# Patient Record
Sex: Female | Born: 1965 | Race: White | Hispanic: No | Smoking: Current every day smoker
Health system: Southern US, Community
[De-identification: ages and names within clinical notes are randomized; demographics above are authoritative.]

## PROBLEM LIST (undated history)

## (undated) DIAGNOSIS — T7840XA Allergy, unspecified, initial encounter: Secondary | ICD-10-CM

## (undated) DIAGNOSIS — F1020 Alcohol dependence, uncomplicated: Secondary | ICD-10-CM

## (undated) DIAGNOSIS — IMO0002 Reserved for concepts with insufficient information to code with codable children: Secondary | ICD-10-CM

## (undated) DIAGNOSIS — F419 Anxiety disorder, unspecified: Secondary | ICD-10-CM

## (undated) DIAGNOSIS — K219 Gastro-esophageal reflux disease without esophagitis: Secondary | ICD-10-CM

## (undated) HISTORY — DX: Reserved for concepts with insufficient information to code with codable children: IMO0002

## (undated) HISTORY — PX: TUBAL LIGATION: SHX77

## (undated) HISTORY — DX: Anxiety disorder, unspecified: F41.9

## (undated) HISTORY — PX: ABDOMINAL HYSTERECTOMY: SHX81

## (undated) HISTORY — DX: Gastro-esophageal reflux disease without esophagitis: K21.9

## (undated) HISTORY — DX: Allergy, unspecified, initial encounter: T78.40XA

---

## 1998-02-12 ENCOUNTER — Encounter: Admission: RE | Admit: 1998-02-12 | Discharge: 1998-02-12 | Payer: Self-pay | Admitting: Internal Medicine

## 1998-07-19 ENCOUNTER — Emergency Department (HOSPITAL_COMMUNITY): Admission: EM | Admit: 1998-07-19 | Discharge: 1998-07-19 | Payer: Self-pay | Admitting: Emergency Medicine

## 1998-07-21 ENCOUNTER — Emergency Department (HOSPITAL_COMMUNITY): Admission: EM | Admit: 1998-07-21 | Discharge: 1998-07-21 | Payer: Self-pay | Admitting: Internal Medicine

## 1998-08-01 ENCOUNTER — Emergency Department (HOSPITAL_COMMUNITY): Admission: EM | Admit: 1998-08-01 | Discharge: 1998-08-01 | Payer: Self-pay | Admitting: Emergency Medicine

## 1998-11-03 ENCOUNTER — Emergency Department (HOSPITAL_COMMUNITY): Admission: EM | Admit: 1998-11-03 | Discharge: 1998-11-03 | Payer: Self-pay | Admitting: Emergency Medicine

## 1998-11-26 ENCOUNTER — Encounter: Admission: RE | Admit: 1998-11-26 | Discharge: 1998-11-26 | Payer: Self-pay | Admitting: Hematology and Oncology

## 2002-01-12 ENCOUNTER — Emergency Department (HOSPITAL_COMMUNITY): Admission: EM | Admit: 2002-01-12 | Discharge: 2002-01-12 | Payer: Self-pay | Admitting: Emergency Medicine

## 2015-09-22 ENCOUNTER — Emergency Department (HOSPITAL_COMMUNITY)
Admission: EM | Admit: 2015-09-22 | Discharge: 2015-09-22 | Disposition: A | Discharge status: Home / Self Care | Payer: Self-pay | Attending: Emergency Medicine | Admitting: Emergency Medicine

## 2015-09-22 ENCOUNTER — Encounter (HOSPITAL_COMMUNITY): Payer: Self-pay | Admitting: Family Medicine

## 2015-09-22 DIAGNOSIS — K029 Dental caries, unspecified: Secondary | ICD-10-CM | POA: Insufficient documentation

## 2015-09-22 DIAGNOSIS — F17210 Nicotine dependence, cigarettes, uncomplicated: Secondary | ICD-10-CM | POA: Insufficient documentation

## 2015-09-22 DIAGNOSIS — K002 Abnormalities of size and form of teeth: Secondary | ICD-10-CM | POA: Insufficient documentation

## 2015-09-22 DIAGNOSIS — Z880 Allergy status to penicillin: Secondary | ICD-10-CM | POA: Insufficient documentation

## 2015-09-22 DIAGNOSIS — K0381 Cracked tooth: Secondary | ICD-10-CM | POA: Insufficient documentation

## 2015-09-22 DIAGNOSIS — K0889 Other specified disorders of teeth and supporting structures: Secondary | ICD-10-CM | POA: Insufficient documentation

## 2015-09-22 DIAGNOSIS — Z8719 Personal history of other diseases of the digestive system: Secondary | ICD-10-CM | POA: Insufficient documentation

## 2015-09-22 MED ORDER — CLINDAMYCIN HCL 150 MG PO CAPS: 300.0000 mg | ORAL_CAPSULE | Freq: Three times a day (TID) | ORAL | Status: DC

## 2015-09-22 MED ORDER — HYDROCODONE-ACETAMINOPHEN 5-325 MG PO TABS
1.0000 | ORAL_TABLET | Freq: Once | ORAL | Status: AC
  Administered 2015-09-22: 1 via ORAL
  Filled 2015-09-22: qty 1

## 2015-09-22 NOTE — Discharge Instructions (Signed)
Take your medications as prescribed. Continue taking Tylenol as prescribed over-the-counter for pain relief. You may also apply ice for 10-15 minutes 3-4 times daily for pain relief. Follow-up with your dentist at your scheduled appointment this week. Return to the emergency department if symptoms worsen or new onset of fever, difficulty breathing, difficulty swallowing, drooling, facial or neck swelling.

## 2015-09-22 NOTE — ED Provider Notes (Signed)
CSN: [id number]     Arrival date & time 09/22/15  1144 History  By signing my name below, I, Marica Otter, attest that this documentation has been prepared under the direction and in the presence of Melburn Hake, [place]. Electronically Signed: Marica Otter, ED Scribe. 09/22/2015. 1:15 PM.  Chief Complaint  Patient presents with  . Dental Pain   The history is provided by the patient. No language interpreter was used.   PCP: No primary care provider on file. HPI Comments: Deborah Peterson is a 49 y.o. female, with PMHx noted below, who presents to the Emergency Department complaining of 5/10 dental pain resulting from a broken tooth onset 4 days ago. Pt endorses associated drainage from affected site. Pt reports taking tylenol at home with minimal relief. Pt reports she has a dentist appointment on Friday. Pt reports an allergy to penicillin and states she is unable to take ibuprofen due to stomach ulcers.  Pt denies neck or facial swelling, sore throat, fever, difficulty swallowing, drooling.    History reviewed. No pertinent past medical history. Past Surgical History  Procedure Laterality Date  . Cesarean section    . Abdominal hysterectomy     History reviewed. No pertinent family history. Social History  Substance Use Topics  . Smoking status: Current Every Day Smoker  . Smokeless tobacco: None  . Alcohol Use: No   OB History    No data available     Review of Systems  Constitutional: Negative for fever and chills.  HENT: Positive for dental problem. Negative for drooling, facial swelling, sore throat and trouble swallowing.    Allergies  Motrin; Penicillins; and Tramadol  Home Medications   Prior to Admission medications   Medication Sig Start Date End Date Taking? Authorizing Provider  clindamycin (CLEOCIN) 150 MG capsule Take 2 capsules (300 mg total) by mouth 3 (three) times daily. May dispense as  capsules 09/22/15   Barrett Henle, PA-C   Triage  Vitals: BP 137/82 mmHg  Pulse 110  Temp(Src) 98.5 F (36.9 C)  Resp 18  Ht  (1.549 m)  Wt 115 lb (52.164 kg)  BMI 21.74 kg/m2  SpO2 100% Physical Exam  Constitutional: She is oriented to person, place, and time. She appears well-developed and well-nourished.  HENT:  Head: Normocephalic.  Mouth/Throat: Uvula is midline, oropharynx is clear and moist and mucous membranes are normal. No oral lesions. No trismus in the jaw. Abnormal dentition. Dental caries present. No dental abscesses or uvula swelling.    Eyes: EOM are normal.  Neck: Normal range of motion. Neck supple.  Cardiovascular: Normal rate.   Pulmonary/Chest: Effort normal. No stridor. No respiratory distress.  Abdominal: She exhibits no distension.  Musculoskeletal: Normal range of motion.  Lymphadenopathy:    She has no cervical adenopathy.  Neurological: She is alert and oriented to person, place, and time.  Psychiatric: She has a normal mood and affect.  Nursing note and vitals reviewed.  ED Course  Procedures (including critical care time)  DIAGNOSTIC STUDIES: Oxygen Saturation is 100% on ra, nl by my interpretation.    COORDINATION OF CARE: 12:57 PM: Discussed treatment plan which includes antibiotics and pain meds with pt at bedside; patient verbalizes understanding and agrees with treatment plan.   MDM   Final diagnoses:  Pain, dental    Pt presents with left upper molar pain. Endorses drainage. Denies fever. She has a dentist appointment on Friday. VSS. Exam revealed decaying left upper molar with tooth receding to  gumline, mild surrounding erythema and swelling, no evidence of dental abscess. No trismus, drooling, neck swelling or stridor on exam. Pt given pain meds. Plan to d/c pt home. Pt given clindamycin (due to penicillin allergy) and advised to continue taking tylenol for pain and follow up with dentist at her scheudled appointment.   Evaluation does not show pathology requring ongoing emergent  intervention or admission. Pt is hemodynamically stable and mentating appropriately. Discussed findings/results and plan with patient/guardian, who agrees with plan. All questions answered. Return precautions discussed and outpatient follow up given.    I personally performed the services described in this documentation, which was scribed in my presence. The recorded information has been reviewed and is accurate.    Satira Sarkicole Elizabeth PrescottNadeau, [place]-C 09/22/15 1953  Geoffery Lyonsouglas Delo, MD 09/23/15 [phone]

## 2015-09-22 NOTE — ED Notes (Signed)
Pt here for broken tooth. Sts she has an appointment this week to get it out.

## 2015-09-22 NOTE — ED Notes (Signed)
Patient has broken tooth, Patient states has appointment with dentisit on Friday but needs medication for pain,

## 2016-11-04 ENCOUNTER — Other Ambulatory Visit: Payer: Self-pay | Admitting: Gastroenterology

## 2016-11-04 DIAGNOSIS — R1310 Dysphagia, unspecified: Secondary | ICD-10-CM

## 2016-11-25 ENCOUNTER — Other Ambulatory Visit: Payer: Self-pay

## 2016-12-16 ENCOUNTER — Inpatient Hospital Stay: Admission: RE | Admit: 2016-12-16 | Payer: Self-pay | Source: Ambulatory Visit

## 2020-08-12 ENCOUNTER — Other Ambulatory Visit: Payer: Self-pay

## 2020-08-12 ENCOUNTER — Encounter (HOSPITAL_COMMUNITY): Payer: Self-pay

## 2020-08-12 ENCOUNTER — Emergency Department (HOSPITAL_COMMUNITY): Payer: 59

## 2020-08-12 ENCOUNTER — Emergency Department (HOSPITAL_COMMUNITY)
Admission: EM | Admit: 2020-08-12 | Discharge: 2020-08-12 | Disposition: A | Discharge status: Home / Self Care | Payer: 59 | Attending: Emergency Medicine | Admitting: Emergency Medicine

## 2020-08-12 DIAGNOSIS — R0789 Other chest pain: Secondary | ICD-10-CM

## 2020-08-12 DIAGNOSIS — F17200 Nicotine dependence, unspecified, uncomplicated: Secondary | ICD-10-CM | POA: Insufficient documentation

## 2020-08-12 DIAGNOSIS — R072 Precordial pain: Secondary | ICD-10-CM | POA: Diagnosis present

## 2020-08-12 LAB — CBC
HCT: 37.8 % (ref 36.0–46.0)
Hemoglobin: 13.2 g/dL (ref 12.0–15.0)
MCH: 35.4 pg — ABNORMAL HIGH (ref 26.0–34.0)
MCHC: 34.9 g/dL (ref 30.0–36.0)
MCV: 101.3 fL — ABNORMAL HIGH (ref 80.0–100.0)
Platelets: 264 10*3/uL (ref 150–400)
RBC: 3.73 MIL/uL — ABNORMAL LOW (ref 3.87–5.11)
RDW: 11.6 % (ref 11.5–15.5)
WBC: 8 10*3/uL (ref 4.0–10.5)
nRBC: 0 % (ref 0.0–0.2)

## 2020-08-12 LAB — BASIC METABOLIC PANEL
Anion gap: 11 (ref 5–15)
BUN: 6 mg/dL (ref 6–20)
CO2: 26 mmol/L (ref 22–32)
Calcium: 9.1 mg/dL (ref 8.9–10.3)
Chloride: 100 mmol/L (ref 98–111)
Creatinine, Ser: 0.54 mg/dL (ref 0.44–1.00)
GFR calc non Af Amer: >60 mL/min (ref >60)
Glucose, Bld: 108 mg/dL — ABNORMAL HIGH (ref 70–99)
Potassium: 4.1 mmol/L (ref 3.5–5.1)
Sodium: 137 mmol/L (ref 135–145)

## 2020-08-12 LAB — TROPONIN I (HIGH SENSITIVITY): Troponin I (High Sensitivity): 3 ng/L (ref <18)

## 2020-08-12 LAB — I-STAT BETA HCG BLOOD, ED (MC, WL, AP ONLY): I-stat hCG, quantitative: <5 m[IU]/mL (ref <5)

## 2020-08-12 NOTE — ED Notes (Signed)
Went in pt room to obtain discharge VS and go over discharge instructions with pt, but pt had already left. Pt's IV was found on the bedside table. Pt A&Ox4, VSS, ambulatory w/ steady gait prior to pt leaving.

## 2020-08-12 NOTE — ED Notes (Signed)
Pt refused lab draw for repeat trop

## 2020-08-12 NOTE — Discharge Instructions (Addendum)
Please return for any problem.  Follow-up with your regular care providers as instructed.  Follow-up with GI as instructed.

## 2020-08-12 NOTE — ED Provider Notes (Signed)
[facility] EMERGENCY DEPARTMENT Provider Note   CSN: [id number] Arrival date & time: 08/12/20  1418     History No chief complaint on file.   Deborah Peterson is a 54 y.o. female.  54 year old female with prior medical history detailed below presents for evaluation of reported chest discomfort.  Patient reports onset of substernal and epigastric pressure.  This began yesterday around noon.  She reports that she did have some alcohol earlier in the day.  She reports a distant history of stomach ulcers.  She reports minimal alcohol use.  She denies associated nausea, vomiting, bloody emesis, bloody stools, or current pain.  The history is provided by the patient.  Chest Pain Pain location:  Substernal area and epigastric Pain quality: aching and burning   Pain severity:  Mild Onset quality:  Gradual Duration:  1 day Timing:  Constant Progression:  Partially resolved Chronicity:  New Relieved by:  Nothing Worsened by:  Nothing      History reviewed. No pertinent past medical history.  There are no problems to display for this patient.   Past Surgical History:  Procedure Laterality Date  . ABDOMINAL HYSTERECTOMY    . CESAREAN SECTION       OB History   No obstetric history on file.     No family history on file.  Social History   Tobacco Use  . Smoking status: Current Every Day Smoker  Substance Use Topics  . Alcohol use: No  . Drug use: Not on file    Home Medications Prior to Admission medications   Medication Sig Start Date End Date Taking? Authorizing Provider  clindamycin (CLEOCIN) 150 MG capsule Take 2 capsules (300 mg total) by mouth 3 (three) times daily. May dispense as 150mg  capsules 09/22/15   09/24/15, PA-C    Allergies    Motrin [ibuprofen], Penicillins, and Tramadol  Review of Systems   Review of Systems  Cardiovascular: Positive for chest pain.  All other systems reviewed and are  negative.   Physical Exam Updated Vital Signs BP 120/63 (BP Location: Right Arm)   Pulse 63   Temp 97.9 F (36.6 C) (Oral)   Resp 15   Ht 5\' 1"  (1.549 m)   Wt 47.6 kg   SpO2 100%   BMI 19.84 kg/m   Physical Exam Vitals and nursing note reviewed.  Constitutional:      General: She is not in acute distress.    Appearance: Normal appearance. She is well-developed.  HENT:     Head: Normocephalic and atraumatic.  Eyes:     Conjunctiva/sclera: Conjunctivae normal.     Pupils: Pupils are equal, round, and reactive to light.  Cardiovascular:     Rate and Rhythm: Normal rate and regular rhythm.     Heart sounds: Normal heart sounds.  Pulmonary:     Effort: Pulmonary effort is normal. No respiratory distress.     Breath sounds: Normal breath sounds.  Abdominal:     General: There is no distension.     Palpations: Abdomen is soft.     Tenderness: There is no abdominal tenderness.  Musculoskeletal:        General: No deformity. Normal range of motion.     Cervical back: Normal range of motion and neck supple.  Skin:    General: Skin is warm and dry.  Neurological:     Mental Status: She is alert and oriented to person, place, and time.     ED  Results / Procedures / Treatments   Labs (all labs ordered are listed, but only abnormal results are displayed) Labs Reviewed  BASIC METABOLIC PANEL - Abnormal; Notable for the following components:      Result Value   Glucose, Bld 108 (*)    All other components within normal limits  CBC - Abnormal; Notable for the following components:   RBC 3.73 (*)    MCV 101.3 (*)    MCH 35.4 (*)    All other components within normal limits  LIPASE, BLOOD  I-STAT BETA HCG BLOOD, ED ([facility], [facility], [facility] ONLY)  TROPONIN I (HIGH SENSITIVITY)  TROPONIN I (HIGH SENSITIVITY)    EKG EKG Interpretation  Date/Time:  Tuesday August 12 2020 14:40:27 EDT Ventricular Rate:  79 PR Interval:  130 QRS Duration: 74 QT Interval:  386 QTC  Calculation: 442 R Axis:   65 Text Interpretation: Normal sinus rhythm Nonspecific T wave abnormality Abnormal ECG No old tracing to compare Confirmed by Dione Booze ([id number]) on 08/12/2020 3:51:30 PM   Radiology DG Chest 2 View  Result Date: 08/12/2020 CLINICAL DATA:  Chest pain for 1 day. EXAM: CHEST - 2 VIEW COMPARISON:  None. FINDINGS: The cardiac silhouette, mediastinal and hilar contours are normal. The lungs are clear.  No pleural effusions or pulmonary lesions. The bony thorax is intact. IMPRESSION: No acute cardiopulmonary findings. Electronically Signed   By: Rudie Meyer M.D.   On: 08/12/2020 15:14    Procedures Procedures (including critical care time)  Medications Ordered in ED Medications - No data to display  ED Course  I have reviewed the triage vital signs and the nursing notes.  Pertinent labs & imaging results that were available during my care of the patient were reviewed by me and considered in my medical decision making (see chart for details).    MDM Rules/Calculators/A&P                          MDM  Screen complete  Deborah Peterson was evaluated in Emergency Department on 08/12/2020 for the symptoms described in the history of present illness. She was evaluated in the context of the global COVID-19 pandemic, which necessitated consideration that the patient might be at risk for infection with the SARS-CoV-2 virus that causes COVID-19. Institutional protocols and algorithms that pertain to the evaluation of patients at risk for COVID-19 are in a state of rapid change based on information released by regulatory bodies including the CDC and federal and state organizations. These policies and algorithms were followed during the patient's care in the ED.  Patient is presenting for evaluation of reported chest discomfort.  Described symptoms are perhaps more consistent with likely gastritis.  Patient does report indiscretion with her alcohol intake on Sunday  prior to development of her symptoms.  Patient does have a significant prior history of gastritis and stomach ulcers.  Patient is encouraged to not drink alcohol.  Described symptoms are not consistent with ACS.  Troponin x1 is very low.  EKG is without evidence of acute ischemia.  Patient refuses additional lab draw for repeat troponin and lipase check.  Patient reports that she feels significant better.  She desires discharge.  She does understand need for close follow-up.  She does understand that if her pain recurs, she has vomiting, she has bloody emesis, she has fever, she should immediately seek medical attention.  Final Clinical Impression(s) / ED Diagnoses Final diagnoses:  Atypical chest pain  Rx / DC Orders ED Discharge Orders    None       Wynetta Fines, MD 08/12/20 2004

## 2020-08-12 NOTE — ED Triage Notes (Signed)
Patient complains of chest pain with no associated symptoms since yesterday. Patient denies previous illness. Alert and oriented

## 2021-12-16 ENCOUNTER — Telehealth: Payer: Self-pay | Admitting: Physician Assistant

## 2021-12-16 DIAGNOSIS — M25531 Pain in right wrist: Secondary | ICD-10-CM

## 2021-12-16 DIAGNOSIS — M25532 Pain in left wrist: Secondary | ICD-10-CM

## 2021-12-16 MED ORDER — METHYLPREDNISOLONE 4 MG PO TBPK: ORAL_TABLET | ORAL | 0 refills | Status: DC

## 2021-12-16 NOTE — Progress Notes (Signed)
Virtual Visit Consent   Deborah Peterson, you are scheduled for a virtual visit with a [facility] provider today.     Just as with appointments in the office, your consent must be obtained to participate.  Your consent will be active for this visit and any virtual visit you may have with one of our providers in the next 365 days.     If you have a MyChart account, a copy of this consent can be sent to you electronically.  All virtual visits are billed to your insurance company just like a traditional visit in the office.    As this is a virtual visit, video technology does not allow for your provider to perform a traditional examination.  This may limit your provider's ability to fully assess your condition.  If your provider identifies any concerns that need to be evaluated in person or the need to arrange testing (such as labs, EKG, etc.), we will make arrangements to do so.     Although advances in technology are sophisticated, we cannot ensure that it will always work on either your end or our end.  If the connection with a video visit is poor, the visit may have to be switched to a telephone visit.  With either a video or telephone visit, we are not always able to ensure that we have a secure connection.     I need to obtain your verbal consent now.   Are you willing to proceed with your visit today?    Deborah Peterson has provided verbal consent on 12/16/2021 for a virtual visit (video or telephone).   Margaretann Loveless, PA-C   Date: 12/16/2021 2:44 PM   Virtual Visit via Video Note   I, Margaretann Loveless, connected with  Deborah Peterson  ([id number], 11-20-1965) on 12/16/21 at  2:45 PM EST by a video-enabled telemedicine application and verified that I am speaking with the correct person using two identifiers.  Location: Patient: Virtual Visit Location Patient: Home Provider: Virtual Visit Location Provider: Home Office   I discussed the limitations of evaluation and  management by telemedicine and the availability of in person appointments. The patient expressed understanding and agreed to proceed.    History of Present Illness: Deborah Peterson is a 56 y.o. who identifies as a female who was assigned female at birth, and is being seen today for bilateral wrist pain.  HPI: Wrist Pain  This is a new problem. The current episode started yesterday (left wrist started yesterday and both wrist woke her up this morning around 0430). The problem occurs constantly. The problem has been gradually worsening. The quality of the pain is described as aching. The pain is moderate. Associated symptoms include a limited range of motion and stiffness. Pertinent negatives include no numbness or tingling. The symptoms are aggravated by activity. She has tried NSAIDS (took ibuprofen and helped pain yesterday but now returned) for the symptoms. Family history does not include gout or rheumatoid arthritis. Her past medical history is significant for gout.   States it does feel similar to her gout but has never happened in her right wrist before.   Problems: There are no problems to display for this patient.   Allergies:  Allergies  Allergen Reactions   Erythromycin Anaphylaxis   Motrin [Ibuprofen]    Penicillins    Tramadol    Ciprofloxacin Itching   Medications:  Current Outpatient Medications:    methylPREDNISolone (MEDROL DOSEPAK) 4 MG TBPK tablet,  6 day taper; take as directed on package instructions, Disp: 21 tablet, Rfl: 0   clindamycin (CLEOCIN) 150 MG capsule, Take 2 capsules (300 mg total) by mouth 3 (three) times daily. May dispense as 150mg  capsules, Disp: 30 capsule, Rfl: 0  Observations/Objective: Patient is well-developed, well-nourished in no acute distress.  Resting comfortably at home.  Head is normocephalic, atraumatic.  No labored breathing.  Speech is clear and coherent with logical content.  Patient is alert and oriented at baseline.     Assessment and Plan: 1. Bilateral wrist pain - methylPREDNISolone (MEDROL DOSEPAK) 4 MG TBPK tablet; 6 day taper; take as directed on package instructions  Dispense: 21 tablet; Refill: 0  - Inflammatory wrist pain, possible gout flare. DDx: Rheumatoid arthritis, osteoarthritis - Will give medrol dose pack - Ice to wrist - May use tylenol  - Consider cock up wrist splints if immobilization needed - Follow up with orthopedics if not improving or if worsening  Follow Up Instructions: I discussed the assessment and treatment plan with the patient. The patient was provided an opportunity to ask questions and all were answered. The patient agreed with the plan and demonstrated an understanding of the instructions.  A copy of instructions were sent to the patient via MyChart unless otherwise noted below.    The patient was advised to call back or seek an in-person evaluation if the symptoms worsen or if the condition fails to improve as anticipated.  Time:  I spent 12 minutes with the patient via telehealth technology discussing the above problems/concerns.    , PA-C

## 2021-12-16 NOTE — Patient Instructions (Signed)
°  Arlyn Dunning, thank you for joining Margaretann Loveless, PA-C for today's virtual visit.  While this provider is not your primary care provider (PCP), if your PCP is located in our provider database this encounter information will be shared with them immediately following your visit.  Consent: (Patient) Deborah Peterson provided verbal consent for this virtual visit at the beginning of the encounter.  Current Medications:  Current Outpatient Medications:    methylPREDNISolone (MEDROL DOSEPAK) 4 MG TBPK tablet, 6 day taper; take as directed on package instructions, Disp: 21 tablet, Rfl: 0   clindamycin (CLEOCIN) 150 MG capsule, Take 2 capsules (300 mg total) by mouth 3 (three) times daily. May dispense as 150mg  capsules, Disp: 30 capsule, Rfl: 0   Medications ordered in this encounter:  Meds ordered this encounter  Medications   methylPREDNISolone (MEDROL DOSEPAK) 4 MG TBPK tablet    Sig: 6 day taper; take as directed on package instructions    Dispense:  21 tablet    Refill:  0    Order Specific Question:   Supervising Provider    Answer:   [[id number]]     *If you need refills on other medications prior to your next appointment, please contact your pharmacy*  Follow-Up: Call back or seek an in-person evaluation if the symptoms worsen or if the condition fails to improve as anticipated.  Other Instructions [facility] [address], [place], [place] URGENT CARE HOURS Monday - Friday: 8:00am to 8:00pm Saturday: 10:00am to 3:00pm   If you have been instructed to have an in-person evaluation today at a local Urgent Care facility, please use the link below. It will take you to a list of all of our available [facility], including address, phone number and hours of operation. Please do not delay care.  [facility]  If you or a family member do not have a primary care provider, use the link below to schedule a visit  and establish care. When you choose a [facility] primary care physician or advanced practice provider, you gain a long-term partner in health. Find a Primary Care Provider  Learn more about [facility]'s in-office and virtual care options: [facility] - Get Care Now

## 2022-02-20 IMAGING — CR DG CHEST 2V
2 series · 2 of 2 positions shown · non-contrast
Comparison: None.

CLINICAL DATA: Chest pain for 1 day.

EXAM:
CHEST - 2 VIEW

[chest lat]
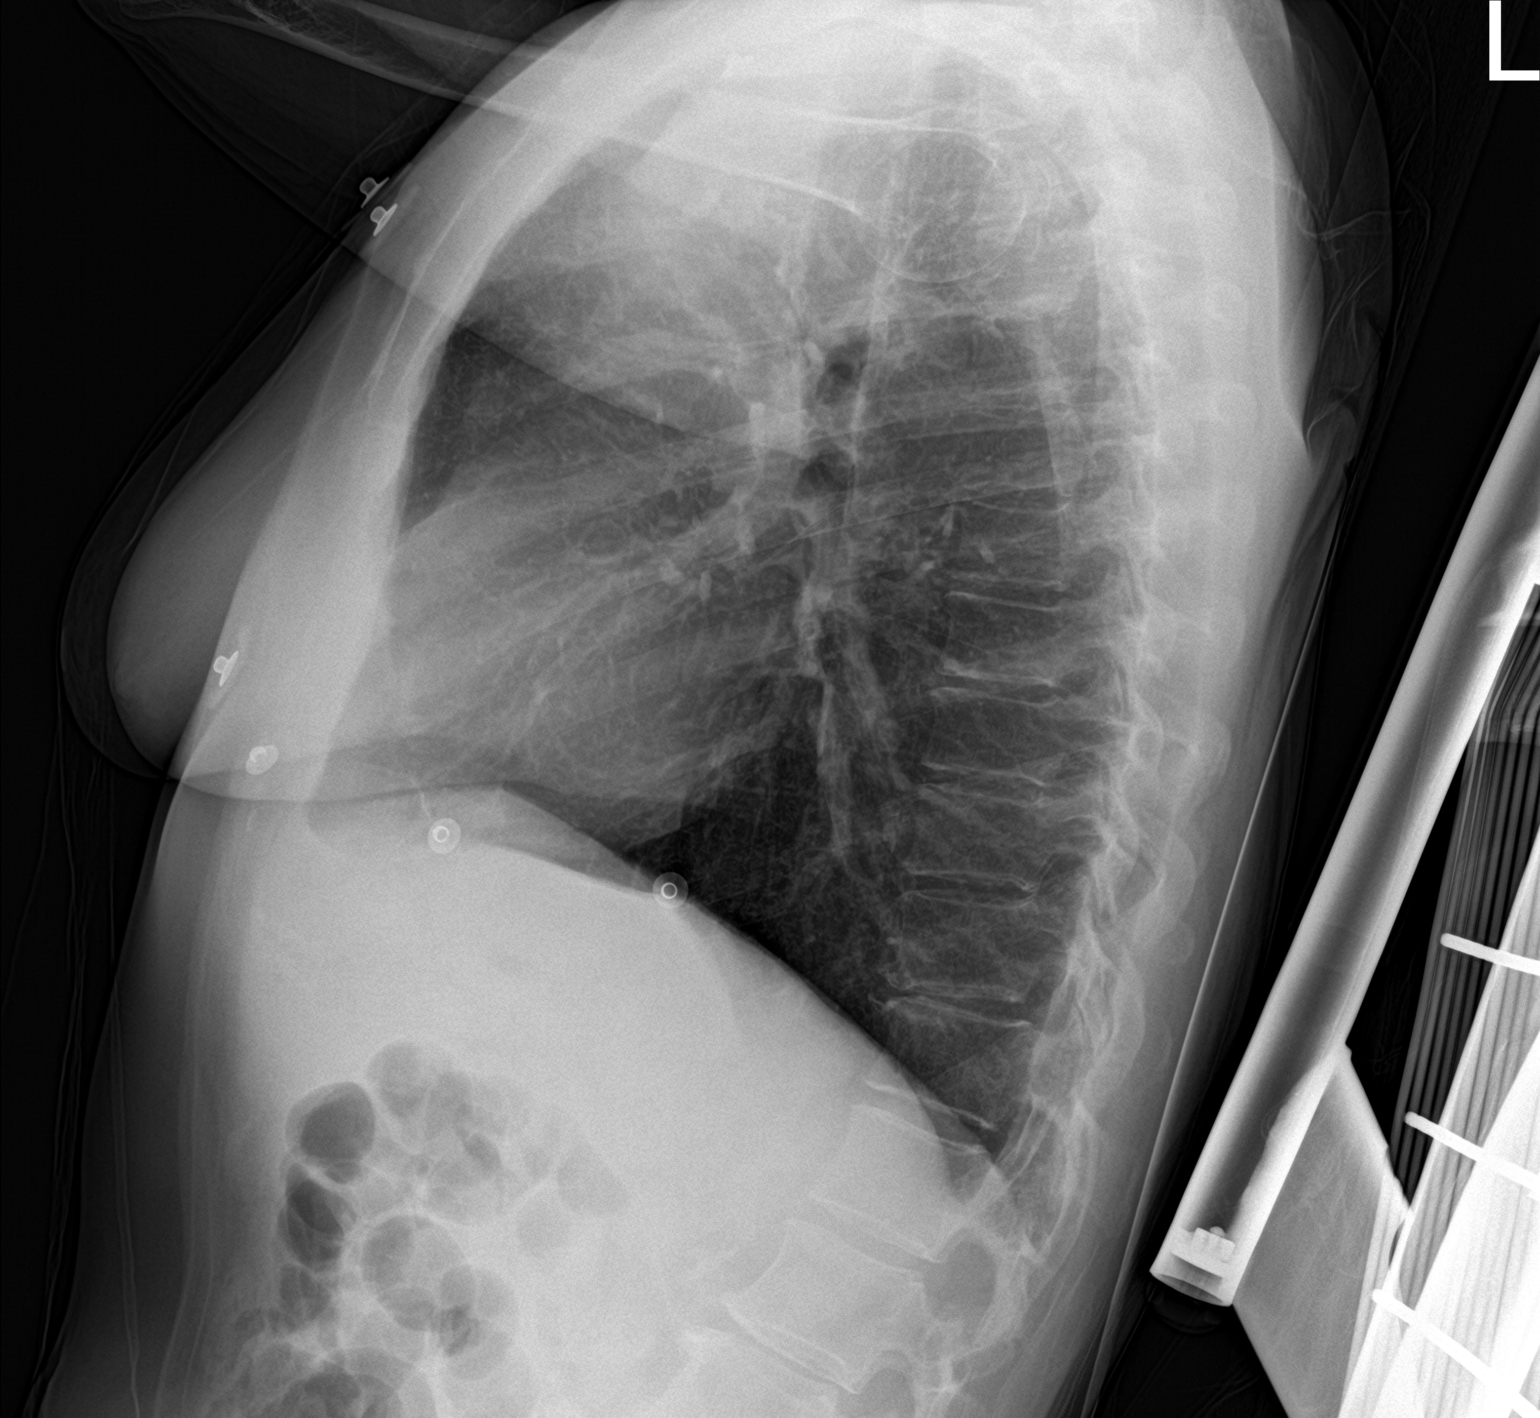

[chest ap]
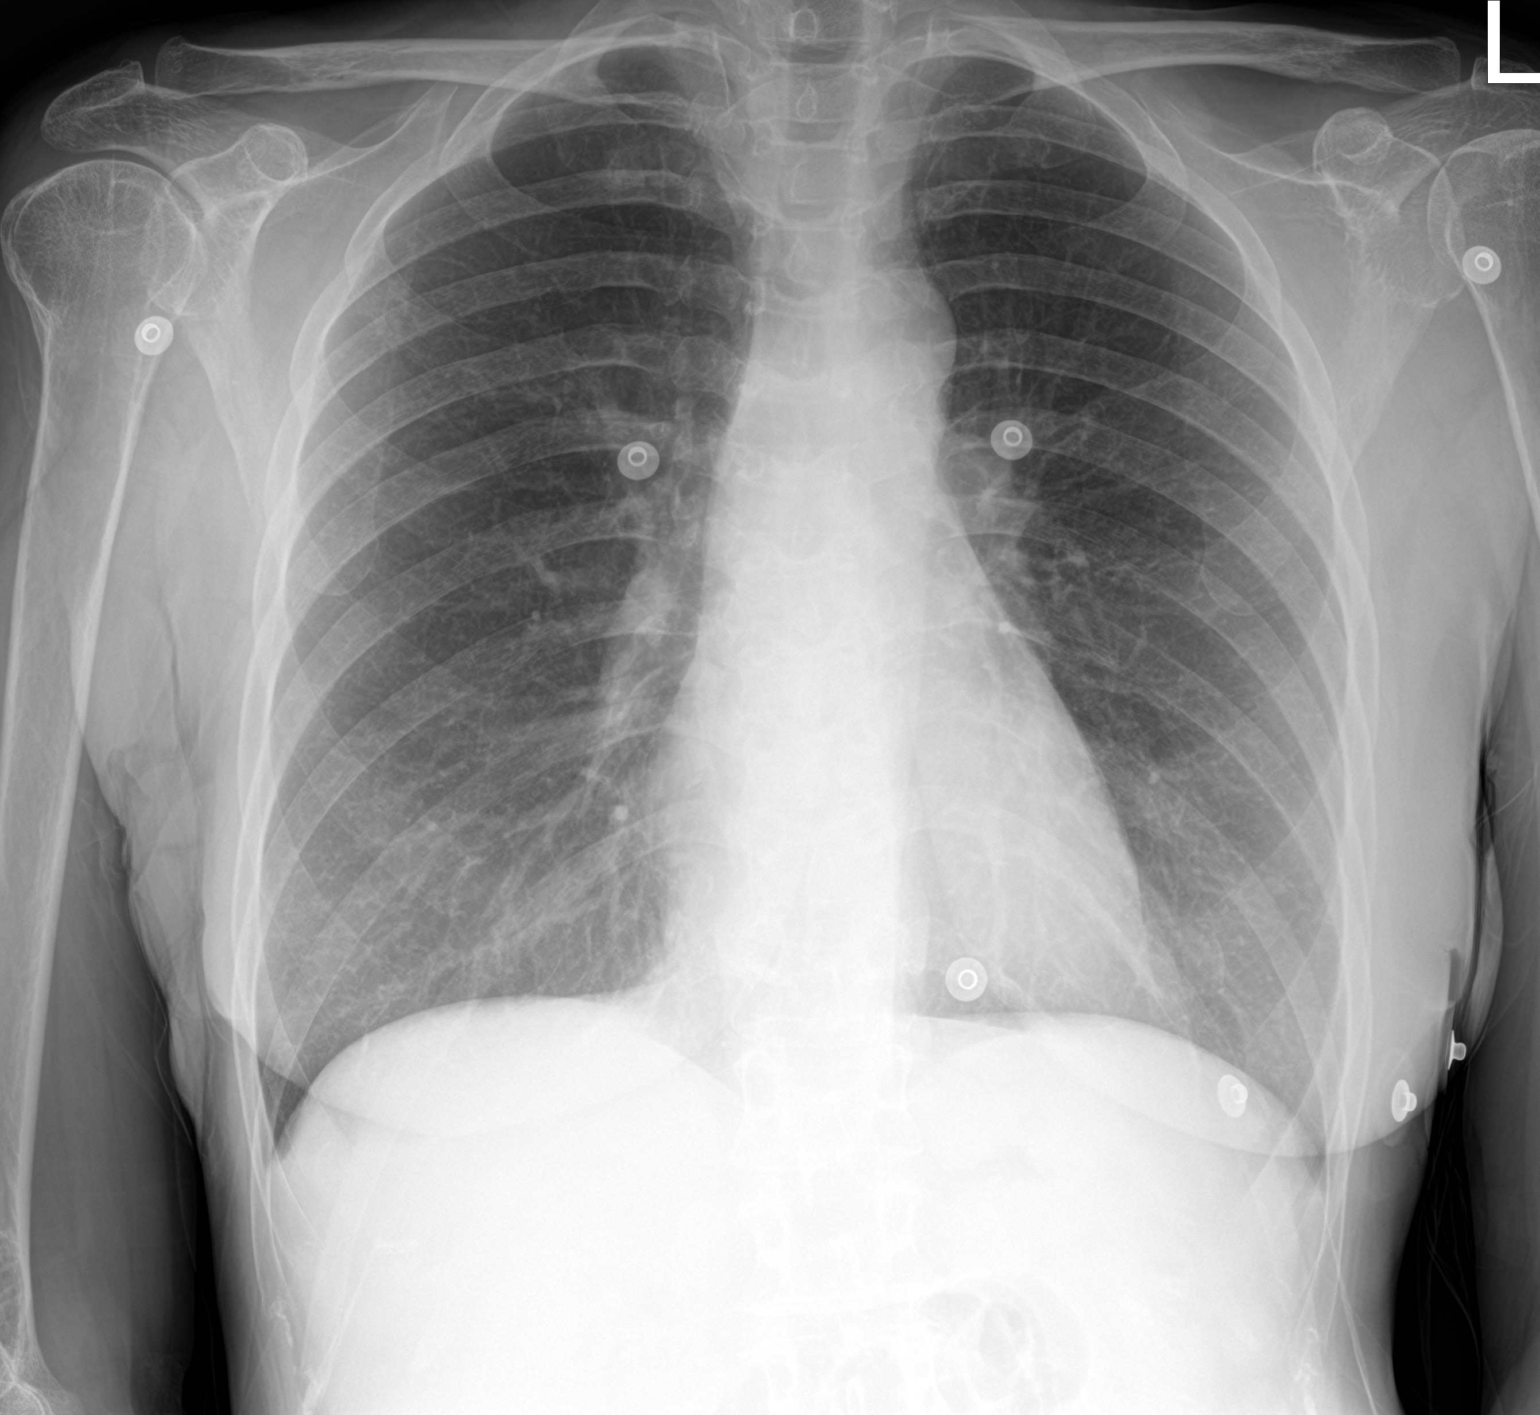

[2 of 2 positions shown; findings below may reference images not displayed]

FINDINGS: The cardiac silhouette, mediastinal and hilar contours are normal.

The lungs are clear.  No pleural effusions or pulmonary lesions.

The bony thorax is intact.
IMPRESSION: No acute cardiopulmonary findings.

## 2022-03-02 NOTE — Progress Notes (Signed)
? ?New Patient Office Visit ? ?Subjective   ? ?Patient ID: Deborah Peterson, female    DOB: 07-01-1966  Age: 56 y.o. MRN: [id number] ? ?CC:  ?Chief Complaint  ?Patient presents with  ? Establish Care  ?  Np. Est care. Pt c/o trouble concentrating w/ feeling brain fog for several years  ? ? ?HPI ?Deborah Peterson presents for new patient visit to establish care.  Introduced to Publishing rights manager role and practice setting.  All questions answered.  Discussed provider/patient relationship and expectations. ? ?She first noticed brain fog and decreased concentration since December 2019. She has been having trouble reading and completing tasks. She has noticed this at work as well, going from one task to the next. This has then increased her anxiety. She states her children have noticed a change. Her mom was diagnosed with alzheimer's disease at 5 and this concerns her. She also forgot to pay her cable bill this month.  ? ?She has a history of gastric ulcers. Her last EGD showed 5 bleeding ulcers. She takes pantoprazole daily and follows with GI routinely.  ? ?Se has been having left hip pain, that has worsened in the last month. She feels like it's going to give way. She describes the pain as sharp. Overuse or frequent walking makes it worse. She has not been taking any medication over the counter to help her symptoms.  ? ?Depression and Anxiety Screen Done: ? ? ?  03/03/2022  ?  2:01 PM  ?Depression screen PHQ 2/9  ?Decreased Interest 2  ?Down, Depressed, Hopeless 0  ?PHQ - 2 Score 2  ?Altered sleeping 0  ?Tired, decreased energy 2  ?Change in appetite 0  ?Feeling bad or failure about yourself  0  ?Trouble concentrating 3  ?Moving slowly or fidgety/restless 0  ?Suicidal thoughts 0  ?PHQ-9 Score 7  ?Difficult doing work/chores Somewhat difficult  ? ? ?  03/03/2022  ?  2:01 PM  ?GAD 7 : Generalized Anxiety Score  ?Nervous, Anxious, on Edge 1  ?Control/stop worrying 2  ?Worry too much - different things 2  ?Trouble  relaxing 0  ?Restless 0  ?Easily annoyed or irritable 1  ?Afraid - awful might happen 1  ?Total GAD 7 Score 7  ? ? ?Outpatient Encounter Medications as of 03/03/2022  ?Medication Sig  ? acetaminophen (TYLENOL) 500 MG tablet Take by mouth.  ? pantoprazole (PROTONIX) 40 MG tablet Take 40 mg by mouth daily.  ? [DISCONTINUED] clindamycin (CLEOCIN) 150 MG capsule Take 2 capsules (300 mg total) by mouth 3 (three) times daily. May dispense as 150mg  capsules  ? [DISCONTINUED] methylPREDNISolone (MEDROL DOSEPAK) 4 MG TBPK tablet 6 day taper; take as directed on package instructions  ? ?No facility-administered encounter medications on file as of 03/03/2022.  ? ? ?Past Medical History:  ?Diagnosis Date  ? Allergy 1985  ? Anxiety 1970  ? GERD (gastroesophageal reflux disease) 2017  ? Ulcer 2019  ? gastric ulcers  ? ? ?Past Surgical History:  ?Procedure Laterality Date  ? ABDOMINAL HYSTERECTOMY    ? CESAREAN SECTION    ? TUBAL LIGATION  1996  ? ? ?Family History  ?Problem Relation Age of Onset  ? Cancer Mother   ?     breast  ? Alzheimer's disease Mother   ? Heart disease Father   ? ADD / ADHD Son   ? Arthritis Maternal Grandmother   ? Vision loss Maternal Grandmother   ? Cancer Paternal Grandmother   ?  ovarian  ? COPD Paternal Grandmother   ? Vision loss Paternal Grandmother   ? Heart disease Paternal Grandfather   ? ? ?Social History  ? ?Socioeconomic History  ? Marital status: Divorced  ?  Spouse name: Not on file  ? Number of children: Not on file  ? Years of education: Not on file  ? Highest education level: Not on file  ?Occupational History  ? Not on file  ?Tobacco Use  ? Smoking status: Every Day  ?  Packs/day: 0.25  ?  Years: 40.00  ?  Pack years: 10.00  ?  Types: Cigarettes  ? Smokeless tobacco: Never  ?Vaping Use  ? Vaping Use: Never used  ?Substance and Sexual Activity  ? Alcohol use: Yes  ?  Alcohol/week: 6.0 standard drinks  ?  Types: 6 Cans of beer per week  ?  Comment: occasionally  ? Drug use: Never  ? Sexual  activity: Not Currently  ?  Birth control/protection: Abstinence, Surgical  ?Other Topics Concern  ? Not on file  ?Social History Narrative  ? Not on file  ? ?Social Determinants of Health  ? ?Financial Resource Strain: Not on file  ?Food Insecurity: Not on file  ?Transportation Needs: Not on file  ?Physical Activity: Not on file  ?Stress: Not on file  ?Social Connections: Not on file  ?Intimate Partner Violence: Not on file  ? ? ?Review of Systems  ?Constitutional:  Positive for malaise/fatigue. Negative for fever.  ?HENT: Negative.    ?Eyes: Negative.   ?Respiratory: Negative.    ?Cardiovascular: Negative.   ?Gastrointestinal: Negative.   ?Genitourinary: Negative.   ?Musculoskeletal:  Positive for joint pain (left hip).  ?Skin: Negative.   ?Neurological:  Positive for tremors (right hand) and headaches (intermittent). Negative for dizziness, sensory change, speech change, focal weakness and weakness.  ?Psychiatric/Behavioral:  The patient is nervous/anxious. The patient does not have insomnia.   ? ?  ?Objective   ? ?BP 116/70 (BP Location: Right Arm, Patient Position: Sitting, Cuff Size: Normal)   Pulse 85   Temp (!) 97.3 ?F (36.3 ?C) (Temporal)   Ht 5\' 2"  (1.575 m)   Wt 110 lb (49.9 kg)   SpO2 92%   BMI 20.12 kg/m?  ? ?Physical Exam ?Vitals and nursing note reviewed.  ?Constitutional:   ?   General: She is not in acute distress. ?   Appearance: Normal appearance.  ?HENT:  ?   Head: Normocephalic and atraumatic.  ?   Right Ear: Tympanic membrane, ear canal and external ear normal.  ?   Left Ear: Tympanic membrane, ear canal and external ear normal.  ?Eyes:  ?   Conjunctiva/sclera: Conjunctivae normal.  ?Cardiovascular:  ?   Rate and Rhythm: Normal rate and regular rhythm.  ?   Pulses: Normal pulses.  ?   Heart sounds: Normal heart sounds.  ?Pulmonary:  ?   Effort: Pulmonary effort is normal.  ?   Breath sounds: Normal breath sounds.  ?Abdominal:  ?   Palpations: Abdomen is soft.  ?   Tenderness: There is no  abdominal tenderness.  ?Musculoskeletal:     ?   General: No tenderness. Normal range of motion.  ?   Cervical back: Normal range of motion. No tenderness.  ?   Right lower leg: No edema.  ?   Left lower leg: No edema.  ?Lymphadenopathy:  ?   Cervical: No cervical adenopathy.  ?Skin: ?   General: Skin is warm and dry.  ?Neurological:  ?  General: No focal deficit present.  ?   Mental Status: She is alert and oriented to person, place, and time.  ?Psychiatric:     ?   Mood and Affect: Mood normal.     ?   Behavior: Behavior normal.     ?   Thought Content: Thought content normal.     ?   Judgment: Judgment normal.  ? ? ?Last CBC ?Lab Results  ?Component Value Date  ? WBC 5.3 03/03/2022  ? HGB 13.5 03/03/2022  ? HCT 39.8 03/03/2022  ? MCV 107.3 (H) 03/03/2022  ? MCH 35.4 (H) 08/12/2020  ? RDW 13.4 03/03/2022  ? PLT 269.0 03/03/2022  ? ?Last metabolic panel ?Lab Results  ?Component Value Date  ? GLUCOSE 103 (H) 03/03/2022  ? NA 139 03/03/2022  ? K 3.9 03/03/2022  ? CL 102 03/03/2022  ? CO2 28 03/03/2022  ? BUN 5 (L) 03/03/2022  ? CREATININE 0.45 03/03/2022  ? GFRNONAA >60 08/12/2020  ? CALCIUM 8.8 03/03/2022  ? PROT 6.5 03/03/2022  ? ALBUMIN 3.9 03/03/2022  ? BILITOT 0.5 03/03/2022  ? ALKPHOS 63 03/03/2022  ? AST 23 03/03/2022  ? ALT 10 03/03/2022  ? ANIONGAP 11 08/12/2020  ? ?Last lipids ?Lab Results  ?Component Value Date  ? CHOL 248 (H) 03/03/2022  ? HDL 91.10 03/03/2022  ? LDLCALC 145 (H) 03/03/2022  ? TRIG 59.0 03/03/2022  ? CHOLHDL 3 03/03/2022  ? ?  ?Assessment & Plan:  ? ?Problem List Items Addressed This Visit   ? ?  ? Digestive  ? Gastroesophageal reflux disease  ?  Chronic, stable. She takes pantoprazole 40mg  daily and follows routinely with GI. Continue collaboration and recommendations from GI.  ? ?  ?  ? Relevant Medications  ? pantoprazole (PROTONIX) 40 MG tablet  ?  ? Other  ? Anxiety - Primary  ?  Chronic, not controlled. She states that she has been having brain fog and trouble concentrating which is  making her anxiety worse. Her PHQ 9 and GAD 7 are both a 7 today. Her mom has a history of early onset alzheimer's at age 57 and she is concerned for this. Will place referral to neurology for further evalu

## 2022-03-03 ENCOUNTER — Encounter: Payer: Self-pay | Admitting: Nurse Practitioner

## 2022-03-03 ENCOUNTER — Ambulatory Visit (INDEPENDENT_AMBULATORY_CARE_PROVIDER_SITE_OTHER): Payer: 59 | Admitting: Nurse Practitioner

## 2022-03-03 ENCOUNTER — Ambulatory Visit (INDEPENDENT_AMBULATORY_CARE_PROVIDER_SITE_OTHER): Payer: 59

## 2022-03-03 VITALS — BP 116/70 | HR 85 | Temp 97.3°F | Ht 62.0 in | Wt 110.0 lb

## 2022-03-03 DIAGNOSIS — R5382 Chronic fatigue, unspecified: Secondary | ICD-10-CM | POA: Diagnosis not present

## 2022-03-03 DIAGNOSIS — K219 Gastro-esophageal reflux disease without esophagitis: Secondary | ICD-10-CM | POA: Diagnosis not present

## 2022-03-03 DIAGNOSIS — R4189 Other symptoms and signs involving cognitive functions and awareness: Secondary | ICD-10-CM | POA: Diagnosis not present

## 2022-03-03 DIAGNOSIS — M25552 Pain in left hip: Secondary | ICD-10-CM

## 2022-03-03 DIAGNOSIS — F419 Anxiety disorder, unspecified: Secondary | ICD-10-CM | POA: Diagnosis not present

## 2022-03-03 DIAGNOSIS — Z720 Tobacco use: Secondary | ICD-10-CM

## 2022-03-03 DIAGNOSIS — Z13220 Encounter for screening for lipoid disorders: Secondary | ICD-10-CM

## 2022-03-03 DIAGNOSIS — Z136 Encounter for screening for cardiovascular disorders: Secondary | ICD-10-CM

## 2022-03-03 LAB — COMPREHENSIVE METABOLIC PANEL
ALT: 10 U/L (ref 0–35)
AST: 23 U/L (ref 0–37)
Albumin: 3.9 g/dL (ref 3.5–5.2)
Alkaline Phosphatase: 63 U/L (ref 39–117)
BUN: 5 mg/dL — ABNORMAL LOW (ref 6–23)
CO2: 28 mEq/L (ref 19–32)
Calcium: 8.8 mg/dL (ref 8.4–10.5)
Chloride: 102 mEq/L (ref 96–112)
Creatinine, Ser: 0.45 mg/dL (ref 0.40–1.20)
GFR: 107.6 mL/min (ref >60.00)
Glucose, Bld: 103 mg/dL — ABNORMAL HIGH (ref 70–99)
Potassium: 3.9 mEq/L (ref 3.5–5.1)
Sodium: 139 mEq/L (ref 135–145)
Total Bilirubin: 0.5 mg/dL (ref 0.2–1.2)
Total Protein: 6.5 g/dL (ref 6.0–8.3)

## 2022-03-03 LAB — CBC WITH DIFFERENTIAL/PLATELET
Basophils Absolute: 0.1 10*3/uL (ref 0.0–0.1)
Basophils Relative: 1 % (ref 0.0–3.0)
Eosinophils Absolute: 0.2 10*3/uL (ref 0.0–0.7)
Eosinophils Relative: 4.3 % (ref 0.0–5.0)
HCT: 39.8 % (ref 36.0–46.0)
Hemoglobin: 13.5 g/dL (ref 12.0–15.0)
Lymphocytes Relative: 20.7 % (ref 12.0–46.0)
Lymphs Abs: 1.1 10*3/uL (ref 0.7–4.0)
MCHC: 33.9 g/dL (ref 30.0–36.0)
MCV: 107.3 fl — ABNORMAL HIGH (ref 78.0–100.0)
Monocytes Absolute: 0.5 10*3/uL (ref 0.1–1.0)
Monocytes Relative: 9.9 % (ref 3.0–12.0)
Neutro Abs: 3.4 10*3/uL (ref 1.4–7.7)
Neutrophils Relative %: 64.1 % (ref 43.0–77.0)
Platelets: 269 10*3/uL (ref 150.0–400.0)
RBC: 3.71 Mil/uL — ABNORMAL LOW (ref 3.87–5.11)
RDW: 13.4 % (ref 11.5–15.5)
WBC: 5.3 10*3/uL (ref 4.0–10.5)

## 2022-03-03 LAB — LIPID PANEL
Cholesterol: 248 mg/dL — ABNORMAL HIGH (ref 0–200)
HDL: 91.1 mg/dL (ref >39.00)
LDL Cholesterol: 145 mg/dL — ABNORMAL HIGH (ref 0–99)
NonHDL: 156.43
Total CHOL/HDL Ratio: 3
Triglycerides: 59 mg/dL (ref 0.0–149.0)
VLDL: 11.8 mg/dL (ref 0.0–40.0)

## 2022-03-03 LAB — VITAMIN D 25 HYDROXY (VIT D DEFICIENCY, FRACTURES): VITD: 13.1 ng/mL — ABNORMAL LOW (ref 30.00–100.00)

## 2022-03-03 LAB — VITAMIN B12: Vitamin B-12: 192 pg/mL — ABNORMAL LOW (ref 211–911)

## 2022-03-03 LAB — TSH: TSH: 1.05 u[IU]/mL (ref 0.35–5.50)

## 2022-03-03 NOTE — Assessment & Plan Note (Signed)
She currently smokes 1/2 ppd of cigarettes. She was smoking 1.5 ppd for about 20 years. She has recently been slowly cutting down and wants to stop. Congratulated her on decreasing her smoking cigarettes.  ?

## 2022-03-03 NOTE — Assessment & Plan Note (Signed)
Chronic, ongoing for the past 3 years. Her symptoms have worsened and include decreased concentration and anxiety. She is concerned because her mom has a history of early onset Alzheimers. Will place referral to neurology.  ?

## 2022-03-03 NOTE — Assessment & Plan Note (Signed)
Chronic intermittent left hip pain. She states that this has worsened over the last month and feels like sometimes her leg may give way and cause her to fall. Will check x-ray of left hip today. She can take tylenol as needed for pain.  ?

## 2022-03-03 NOTE — Patient Instructions (Addendum)
It was great to see you! ? ?I have placed an order for screening mammogram, they should call you to schedule. If you do not hear from them in the next week, please call: ? ?[facility] ?[address] ?[place], [place] ?[phone] ? ?We are checking your labs today and will let you know the results via mychart/phone.  ? ?I have placed a referral to neurology for further evaluation.  ? ?You can take tylenol as needed for your hip pain. I have also attached hip exercises to do daily.  ? ?Let's follow-up in 3 months, sooner if you have concerns. ? ?If a referral was placed today, you will be contacted for an appointment. Please note that routine referrals can sometimes take up to 3-4 weeks to process. Please call our office if you haven't heard anything after this time frame. ? ?Take care, ? ?Rodman Pickle, NP ? ?

## 2022-03-03 NOTE — Assessment & Plan Note (Signed)
Chronic, stable. She takes pantoprazole 40mg  daily and follows routinely with GI. Continue collaboration and recommendations from GI.  ?

## 2022-03-03 NOTE — Assessment & Plan Note (Signed)
Chronic fatigue, along with brain fog and anxiety. Will check CMP, CBC, TSH, vitamin D and vitamin B12 today.  ?

## 2022-03-03 NOTE — Assessment & Plan Note (Signed)
Chronic, not controlled. She states that she has been having brain fog and trouble concentrating which is making her anxiety worse. Her PHQ 9 and GAD 7 are both a 7 today. Her mom has a history of early onset alzheimer's at age 57 and she is concerned for this. Will place referral to neurology for further evaluation and then determine appropriate treatment. Follow-up in 3 months.  ?

## 2022-03-04 MED ORDER — VITAMIN D (ERGOCALCIFEROL) 1.25 MG (50000 UNIT) PO CAPS: 50000.0000 [IU] | ORAL_CAPSULE | ORAL | 0 refills | Status: DC

## 2022-03-04 NOTE — Addendum Note (Signed)
Addended by: Rodman Pickle A on: 03/04/2022 08:11 AM ? ? Modules accepted: Orders ? ?

## 2022-03-05 ENCOUNTER — Telehealth: Payer: Self-pay | Admitting: Nurse Practitioner

## 2022-03-05 NOTE — Telephone Encounter (Signed)
Pt calling in for results of hip xray.  ?Call back # [phone] ?

## 2022-03-07 ENCOUNTER — Encounter: Payer: Self-pay | Admitting: Nurse Practitioner
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
## 2022-03-08 ENCOUNTER — Encounter: Payer: Self-pay | Admitting: Physician Assistant

## 2022-03-08 ENCOUNTER — Ambulatory Visit: Payer: 59 | Admitting: Physician Assistant

## 2022-03-08 VITALS — BP 146/74 | HR 83 | Ht 62.0 in | Wt 112.0 lb

## 2022-03-08 DIAGNOSIS — F419 Anxiety disorder, unspecified: Secondary | ICD-10-CM | POA: Diagnosis not present

## 2022-03-08 DIAGNOSIS — D518 Other vitamin B12 deficiency anemias: Secondary | ICD-10-CM

## 2022-03-08 DIAGNOSIS — R413 Other amnesia: Secondary | ICD-10-CM | POA: Diagnosis not present

## 2022-03-08 NOTE — Patient Instructions (Addendum)
It was a pleasure to see you today at our office.  ? ?Recommendations: ? ?MRI of the brain, the radiology office will call you to arrange you appointment ?Start B12 1000 mcg daily to improve the B12 levels .  ?Take care of the cholesterol and other cardiovascular risk factors  ?See you in about 4 weeks ? ?RECOMMENDATIONS FOR ALL PATIENTS WITH MEMORY PROBLEMS: ?1. Continue to exercise (Recommend 30 minutes of walking everyday, or 3 hours every week) ?2. Increase social interactions - continue going to [place] and enjoy social gatherings with friends and family ?3. Eat healthy, avoid fried foods and eat more fruits and vegetables ?4. Maintain adequate blood pressure, blood sugar, and blood cholesterol level. Reducing the risk of stroke and cardiovascular disease also helps promoting better memory. ?5. Avoid stressful situations. Live a simple life and avoid aggravations. Organize your time and prepare for the next day in anticipation. ?6. Sleep well, avoid any interruptions of sleep and avoid any distractions in the bedroom that may interfere with adequate sleep quality ?7. Avoid sugar, avoid sweets as there is a strong link between excessive sugar intake, diabetes, and cognitive impairment ?We discussed the Mediterranean diet, which has been shown to help patients reduce the risk of progressive memory disorders and reduces cardiovascular risk. This includes eating fish, eat fruits and green leafy vegetables, nuts like almonds and hazelnuts, walnuts, and also use olive oil. Avoid fast foods and fried foods as much as possible. Avoid sweets and sugar as sugar use has been linked to worsening of memory function. ? ?There is always a concern of gradual progression of memory problems. If this is the case, then we may need to adjust level of care according to patient needs. Support, both to the patient and caregiver, should then be put into place.  ? ? ? ? ?FALL PRECAUTIONS: Be cautious when walking. Scan the area for  obstacles that may increase the risk of trips and falls. When getting up in the mornings, sit up at the edge of the bed for a few minutes before getting out of bed. Consider elevating the bed at the head end to avoid drop of blood pressure when getting up. Walk always in a well-lit room (use night lights in the walls). Avoid area rugs or power cords from appliances in the middle of the walkways. Use a walker or a cane if necessary and consider physical therapy for balance exercise. Get your eyesight checked regularly. ? ?FINANCIAL OVERSIGHT: Supervision, especially oversight when making financial decisions or transactions is also recommended. ? ?HOME SAFETY: Consider the safety of the kitchen when operating appliances like stoves, microwave oven, and blender. Consider having supervision and share cooking responsibilities until no longer able to participate in those. Accidents with firearms and other hazards in the house should be identified and addressed as well. ? ? ?ABILITY TO BE LEFT ALONE: If patient is unable to contact 911 operator, consider using LifeLine, or when the need is there, arrange for someone to stay with patients. Smoking is a fire hazard, consider supervision or cessation. Risk of wandering should be assessed by caregiver and if detected at any point, supervision and safe proof recommendations should be instituted. ? ?MEDICATION SUPERVISION: Inability to self-administer medication needs to be constantly addressed. Implement a mechanism to ensure safe administration of the medications. ? ? ?DRIVING: Regarding driving, in patients with progressive memory problems, driving will be impaired. We advise to have someone else do the driving if trouble finding directions or if minor  accidents are reported. Independent driving assessment is available to determine safety of driving. ? ? ?If you are interested in the driving assessment, you can contact the following: ? ?[facility] [employer] [facility] in [place] ? ?[place] [phone] ? ?[facility] [phone] ? ?[facility] [phone] or [phone] ? ? ? ?Mediterranean Diet ?A Mediterranean diet refers to food and lifestyle choices that are based on the traditions of countries located on the [employer]. This way of eating has been shown to help prevent certain conditions and improve outcomes for people who have chronic diseases, like kidney disease and heart disease. ?What are tips for following this plan? ?Lifestyle  ?Cook and eat meals together with your family, when possible. ?Drink enough fluid to keep your urine clear or pale yellow. ?Be physically active every day. This includes: ?Aerobic exercise like running or swimming. ?Leisure activities like gardening, walking, or housework. ?Get 7-8 hours of sleep each night. ?If recommended by your health care provider, drink red wine in moderation. This means 1 glass a day for nonpregnant women and 2 glasses a day for men. A glass of wine equals 5 oz (150 mL). ?Reading food labels  ?Check the serving size of packaged foods. For foods such as rice and pasta, the serving size refers to the amount of cooked product, not dry. ?Check the total fat in packaged foods. Avoid foods that have saturated fat or trans fats. ?Check the ingredients list for added sugars, such as corn syrup. ?Shopping  ?At the grocery store, buy most of your food from the areas near the walls of the store. This includes: ?Fresh fruits and vegetables (produce). ?Grains, beans, nuts, and seeds. Some of these may be available in unpackaged forms or large amounts (in bulk). ?Fresh seafood. ?Poultry and eggs. ?Low-fat dairy products. ?Buy whole ingredients instead of prepackaged foods. ?Buy fresh fruits and vegetables in-season from local farmers markets. ?Buy frozen fruits and vegetables in resealable bags. ?If you do not have access to quality fresh seafood, buy precooked frozen shrimp or canned  fish, such as tuna, salmon, or sardines. ?Buy small amounts of raw or cooked vegetables, salads, or olives from the deli or salad bar at your store. ?Stock your pantry so you always have certain foods on hand, such as olive oil, canned tuna, canned tomatoes, rice, pasta, and beans. ?Cooking  ?Cook foods with extra-virgin olive oil instead of using butter or other vegetable oils. ?Have meat as a side dish, and have vegetables or grains as your main dish. This means having meat in small portions or adding small amounts of meat to foods like pasta or stew. ?Use beans or vegetables instead of meat in common dishes like chili or lasagna. ?Experiment with different cooking methods. Try roasting or broiling vegetables instead of steaming or saut?eing them. ?Add frozen vegetables to soups, stews, pasta, or rice. ?Add nuts or seeds for added healthy fat at each meal. You can add these to yogurt, salads, or vegetable dishes. ?Marinate fish or vegetables using olive oil, lemon juice, garlic, and fresh herbs. ?Meal planning  ?Plan to eat 1 vegetarian meal one day each week. Try to work up to 2 vegetarian meals, if possible. ?Eat seafood 2 or more times a week. ?Have healthy snacks readily available, such as: ?Vegetable sticks with hummus. ?[place] yogurt. ?Fruit and nut trail mix. ?Eat balanced meals throughout the week. This includes: ?Fruit: 2-3 servings a day ?Vegetables: 4-5 servings a day ?Low-fat dairy: 2 servings a day ?  Fish, poultry, or lean meat: 1 serving a day ?Beans and legumes: 2 or more servings a week ?Nuts and seeds: 1-2 servings a day ?Whole grains: 6-8 servings a day ?Extra-virgin olive oil: 3-4 servings a day ?Limit red meat and sweets to only a few servings a month ?What are my food choices? ?Mediterranean diet ?Recommended ?Grains: Whole-grain pasta. Brown rice. Bulgar wheat. Polenta. Couscous. Whole-wheat bread. Orpah Cobb. ?Vegetables: Artichokes. Beets. Broccoli. Cabbage. Carrots. Eggplant. Green  beans. Chard. Kale. Spinach. Onions. Leeks. Peas. Squash. Tomatoes. Peppers. Radishes. ?Fruits: Apples. Apricots. Avocado. Berries. Bananas. Cherries. Dates. Figs. Grapes. Lemons. Melon. Oranges. Peaches. Plum

## 2022-03-18 NOTE — Progress Notes (Signed)
\ ? ?Established Patient Office Visit ? ?Subjective   ?Patient ID: Deborah Peterson, female    DOB: 02/12/1966  Age: 56 y.o. MRN: [id number] ? ?Chief Complaint  ?Patient presents with  ? Follow-up  ?  Anxiety and depression pt states she still feeling about the same.   ? ? ?HPI ? ?Deborah Peterson is here to follow-up on anxiety and trouble concentrating. She went to neurology and was told that her symptoms are not concerning for dementia, however they are still doing further work-up.  ? ?She is still having trouble with anxiety and trouble focusing. She feels like she can't grab all the pieces. She has a large garden and usually plants a variety of things and now she only has 2 things planted. She feels like she has anxiety with the trouble focusing. Denies depressive symptoms and trouble sleeping. She has been on wellbutrin, lexapro, prozac in the past.   ? ? ?  03/19/2022  ?  9:47 AM 03/03/2022  ?  2:01 PM  ?Depression screen PHQ 2/9  ?Decreased Interest 1 2  ?Down, Depressed, Hopeless 0 0  ?PHQ - 2 Score 1 2  ?Altered sleeping 0 0  ?Tired, decreased energy 3 2  ?Change in appetite 0 0  ?Feeling bad or failure about yourself  1 0  ?Trouble concentrating 3 3  ?Moving slowly or fidgety/restless 1 0  ?Suicidal thoughts 0 0  ?PHQ-9 Score 9 7  ?Difficult doing work/chores Very difficult Somewhat difficult  ? ? ?  03/19/2022  ?  9:47 AM 03/03/2022  ?  2:01 PM  ?GAD 7 : Generalized Anxiety Score  ?Nervous, Anxious, on Edge 3 1  ?Control/stop worrying 0 2  ?Worry too much - different things 0 2  ?Trouble relaxing 2 0  ?Restless  0  ?Easily annoyed or irritable 0 1  ?Afraid - awful might happen 0 1  ?Total GAD 7 Score  7  ?Anxiety Difficulty Very difficult   ? ? ?Past Medical History:  ?Diagnosis Date  ? Allergy 1985  ? Anxiety 1970  ? GERD (gastroesophageal reflux disease) 2017  ? Ulcer 2019  ? gastric ulcers  ? ?Past Surgical History:  ?Procedure Laterality Date  ? ABDOMINAL HYSTERECTOMY    ? CESAREAN SECTION    ? TUBAL LIGATION   1996  ? ?ROS ?See pertinent positives and negatives per HPI. ?  ?Objective:  ?  ? ?BP 110/70 (BP Location: Left Arm, Patient Position: Sitting, Cuff Size: Normal)   Pulse 89   Temp (!) 97.4 ?F (36.3 ?C) (Temporal)   Wt 110 lb 6.4 oz (50.1 kg)   SpO2 99%   BMI 20.19 kg/m?  ? ? ?Physical Exam ?Vitals and nursing note reviewed.  ?Constitutional:   ?   General: She is not in acute distress. ?   Appearance: Normal appearance.  ?HENT:  ?   Head: Normocephalic.  ?Eyes:  ?   Conjunctiva/sclera: Conjunctivae normal.  ?Cardiovascular:  ?   Rate and Rhythm: Normal rate and regular rhythm.  ?   Pulses: Normal pulses.  ?   Heart sounds: Normal heart sounds.  ?Pulmonary:  ?   Effort: Pulmonary effort is normal.  ?   Breath sounds: Normal breath sounds.  ?Musculoskeletal:  ?   Cervical back: Normal range of motion.  ?Skin: ?   General: Skin is warm.  ?Neurological:  ?   General: No focal deficit present.  ?   Mental Status: She is alert and oriented to person, place, and  time.  ?Psychiatric:     ?   Mood and Affect: Mood normal.     ?   Behavior: Behavior normal.     ?   Thought Content: Thought content normal.     ?   Judgment: Judgment normal.  ? ?The 10-year ASCVD risk score (Arnett DK, et al., 2019) is: 3.1% ? ?  ?Assessment & Plan:  ? ?Problem List Items Addressed This Visit   ? ?  ? Other  ? Concentration deficit - Primary  ?  Chronic, ongoing.  She states that she has tried several medications for anxiety in the past including Wellbutrin, Lexapro, Prozac.  She states that these medications do not help.  We will place referral to psychology for ADHD testing.  We will have her follow-up in 3 months or sooner with concerns. ? ?  ?  ? Relevant Orders  ? Ambulatory referral to Psychology  ? ?Other Visit Diagnoses   ? ? Need for diphtheria-tetanus-pertussis (Tdap) vaccine      ? Tdap booster updated today  ? Relevant Orders  ? Tdap vaccine greater than or equal to 7yo IM (Completed)  ? Need for shingles vaccine      ?  Shingrix #1 given today. Due for next shingrix in 2-6 months  ? Relevant Orders  ? Varicella-zoster vaccine IM (Shingrix) (Completed)  ? Encounter for screening mammogram for malignant neoplasm of breast      ? Mammogram orderd today  ? Relevant Orders  ? MM 3D SCREEN BREAST BILATERAL  ? ?  ? ? ?Return in about 3 months (around 06/19/2022) for CPE.  ? ? ?Gerre Scull, NP ? ?

## 2022-03-19 ENCOUNTER — Ambulatory Visit (INDEPENDENT_AMBULATORY_CARE_PROVIDER_SITE_OTHER): Payer: 59 | Admitting: Nurse Practitioner

## 2022-03-19 ENCOUNTER — Encounter: Payer: Self-pay | Admitting: Nurse Practitioner

## 2022-03-19 VITALS — BP 110/70 | HR 89 | Temp 97.4°F | Wt 110.4 lb

## 2022-03-19 DIAGNOSIS — R41840 Attention and concentration deficit: Secondary | ICD-10-CM

## 2022-03-19 DIAGNOSIS — Z1231 Encounter for screening mammogram for malignant neoplasm of breast: Secondary | ICD-10-CM | POA: Diagnosis not present

## 2022-03-19 DIAGNOSIS — Z23 Encounter for immunization: Secondary | ICD-10-CM

## 2022-03-19 NOTE — Assessment & Plan Note (Signed)
Chronic, ongoing.  She states that she has tried several medications for anxiety in the past including Wellbutrin, Lexapro, Prozac.  She states that these medications do not help.  We will place referral to psychology for ADHD testing.  We will have her follow-up in 3 months or sooner with concerns. ?

## 2022-03-19 NOTE — Patient Instructions (Addendum)
It was great to see you! ? ?Call your neurologist and let them know the MRI needs to be done at a [facility] (hospital or medcenter) ? ?I have placed a referral to psychiatry for ADHD testing ? ?I have ordered your mammogram at [facility]. Call to schedule if you do not hear from them.  ?[address], [place], [place] [address] ?[phone] ? ?Let's follow-up in 3 months, sooner if you have concerns. ? ?If a referral was placed today, you will be contacted for an appointment. Please note that routine referrals can sometimes take up to 3-4 weeks to process. Please call our office if you haven't heard anything after this time frame. ? ?Take care, ? ?Vance Peper, NP ? ?

## 2022-04-08 ENCOUNTER — Ambulatory Visit: Payer: 59 | Admitting: Physician Assistant

## 2022-04-09 ENCOUNTER — Telehealth: Payer: 59 | Admitting: Family Medicine

## 2022-04-09 DIAGNOSIS — J208 Acute bronchitis due to other specified organisms: Secondary | ICD-10-CM

## 2022-04-09 MED ORDER — PROMETHAZINE-DM 6.25-15 MG/5ML PO SYRP: 5.0000 mL | ORAL_SOLUTION | Freq: Four times a day (QID) | ORAL | 0 refills | Status: DC | PRN

## 2022-04-09 MED ORDER — BENZONATATE 100 MG PO CAPS: 100.0000 mg | ORAL_CAPSULE | Freq: Three times a day (TID) | ORAL | 0 refills | Status: DC | PRN

## 2022-04-09 NOTE — Progress Notes (Signed)
We are sorry that you are not feeling well.  Here is how we plan to help!  Based on your presentation I believe you most likely have A cough due to a virus.  This is called viral bronchitis and is best treated by rest, plenty of fluids and control of the cough.  You may use Ibuprofen or Tylenol as directed to help your symptoms.     In addition you may use the cough syrup I have ordered called promethazine DM- this can make you sleepy so please do no drive and use this at the same time.  From your responses in the eVisit questionnaire you describe inflammation in the upper respiratory tract which is causing a significant cough.  This is commonly called Bronchitis and has four common causes:   Allergies Viral Infections Acid Reflux Bacterial Infection Allergies, viruses and acid reflux are treated by controlling symptoms or eliminating the cause. An example might be a cough caused by taking certain blood pressure medications. You stop the cough by changing the medication. Another example might be a cough caused by acid reflux. Controlling the reflux helps control the cough.  USE OF BRONCHODILATOR ("RESCUE") INHALERS: There is a risk from using your bronchodilator too frequently.  The risk is that over-reliance on a medication which only relaxes the muscles surrounding the breathing tubes can reduce the effectiveness of medications prescribed to reduce swelling and congestion of the tubes themselves.  Although you feel brief relief from the bronchodilator inhaler, your asthma may actually be worsening with the tubes becoming more swollen and filled with mucus.  This can delay other crucial treatments, such as oral steroid medications. If you need to use a bronchodilator inhaler daily, several times per day, you should discuss this with your provider.  There are probably better treatments that could be used to keep your asthma under control.     HOME CARE Only take medications as instructed by your  medical team. Complete the entire course of an antibiotic. Drink plenty of fluids and get plenty of rest. Avoid close contacts especially the very young and the elderly Cover your mouth if you cough or cough into your sleeve. Always remember to wash your hands A steam or ultrasonic humidifier can help congestion.   GET HELP RIGHT AWAY IF: You develop worsening fever. You become short of breath You cough up blood. Your symptoms persist after you have completed your treatment plan MAKE SURE YOU  Understand these instructions. Will watch your condition. Will get help right away if you are not doing well or get worse.    Thank you for choosing an e-visit.  Your e-visit answers were reviewed by a board certified advanced clinical practitioner to complete your personal care plan. Depending upon the condition, your plan could have included both over the counter or prescription medications.  Please review your pharmacy choice. Make sure the pharmacy is open so you can pick up prescription now. If there is a problem, you may contact your provider through [employer] and have the prescription routed to another pharmacy.  Your safety is important to [place]. If you have drug allergies check your prescription carefully.   For the next 24 hours you can use MyChart to ask questions about today's visit, request a non-urgent call back, or ask for a work or school excuse. You will get an email in the next two days asking about your experience. I hope that your e-visit has been valuable and will speed your recovery.  I provided 5 minutes of non face-to-face time during this encounter for chart review, medication and order placement, as well as and documentation.

## 2022-04-09 NOTE — Addendum Note (Signed)
Addended by: Margaretann Loveless on: 04/09/2022 02:47 PM   Modules accepted: Orders

## 2022-04-15 ENCOUNTER — Encounter: Payer: Self-pay | Admitting: Nurse Practitioner

## 2022-05-25 ENCOUNTER — Encounter: Payer: Self-pay | Admitting: Nurse Practitioner

## 2022-06-25 ENCOUNTER — Encounter: Payer: 59 | Admitting: Nurse Practitioner

## 2022-07-08 ENCOUNTER — Encounter: Payer: Self-pay | Admitting: Nurse Practitioner

## 2022-08-19 ENCOUNTER — Encounter: Payer: Self-pay | Admitting: Nurse Practitioner

## 2022-11-02 ENCOUNTER — Ambulatory Visit: Payer: Self-pay | Admitting: Nurse Practitioner

## 2022-11-02 ENCOUNTER — Telehealth: Payer: Self-pay | Admitting: Nurse Practitioner

## 2022-11-02 NOTE — Telephone Encounter (Signed)
Pt was a no show for an OV with Lauren on 11/02/22, I sent a letter.

## 2022-11-12 NOTE — Telephone Encounter (Signed)
Noted  

## 2022-11-12 NOTE — Telephone Encounter (Signed)
1st no show, fee waived, letter sent 

## 2023-01-05 ENCOUNTER — Encounter: Payer: 59 | Admitting: Nurse Practitioner

## 2023-02-05 ENCOUNTER — Inpatient Hospital Stay (HOSPITAL_COMMUNITY)
Admission: EM | Admit: 2023-02-05 | Discharge: 2023-02-13 | DRG: 440 | Disposition: A | Discharge status: Home / Self Care | Payer: 59 | Attending: Internal Medicine | Admitting: Internal Medicine

## 2023-02-05 ENCOUNTER — Encounter (HOSPITAL_COMMUNITY): Payer: Self-pay

## 2023-02-05 ENCOUNTER — Emergency Department (HOSPITAL_COMMUNITY): Payer: 59

## 2023-02-05 ENCOUNTER — Other Ambulatory Visit: Payer: Self-pay

## 2023-02-05 DIAGNOSIS — R7401 Elevation of levels of liver transaminase levels: Secondary | ICD-10-CM | POA: Diagnosis present

## 2023-02-05 DIAGNOSIS — Z8711 Personal history of peptic ulcer disease: Secondary | ICD-10-CM

## 2023-02-05 DIAGNOSIS — Z820 Family history of epilepsy and other diseases of the nervous system: Secondary | ICD-10-CM

## 2023-02-05 DIAGNOSIS — R918 Other nonspecific abnormal finding of lung field: Secondary | ICD-10-CM | POA: Diagnosis present

## 2023-02-05 DIAGNOSIS — Z885 Allergy status to narcotic agent status: Secondary | ICD-10-CM

## 2023-02-05 DIAGNOSIS — Z79899 Other long term (current) drug therapy: Secondary | ICD-10-CM

## 2023-02-05 DIAGNOSIS — F17210 Nicotine dependence, cigarettes, uncomplicated: Secondary | ICD-10-CM | POA: Diagnosis present

## 2023-02-05 DIAGNOSIS — K5730 Diverticulosis of large intestine without perforation or abscess without bleeding: Secondary | ICD-10-CM | POA: Diagnosis present

## 2023-02-05 DIAGNOSIS — D751 Secondary polycythemia: Secondary | ICD-10-CM | POA: Diagnosis present

## 2023-02-05 DIAGNOSIS — K860 Alcohol-induced chronic pancreatitis: Secondary | ICD-10-CM | POA: Diagnosis present

## 2023-02-05 DIAGNOSIS — F419 Anxiety disorder, unspecified: Secondary | ICD-10-CM | POA: Diagnosis present

## 2023-02-05 DIAGNOSIS — Z880 Allergy status to penicillin: Secondary | ICD-10-CM

## 2023-02-05 DIAGNOSIS — K219 Gastro-esophageal reflux disease without esophagitis: Secondary | ICD-10-CM | POA: Diagnosis present

## 2023-02-05 DIAGNOSIS — D72829 Elevated white blood cell count, unspecified: Secondary | ICD-10-CM | POA: Diagnosis present

## 2023-02-05 DIAGNOSIS — Z86010 Personal history of colonic polyps: Secondary | ICD-10-CM

## 2023-02-05 DIAGNOSIS — K852 Alcohol induced acute pancreatitis without necrosis or infection: Secondary | ICD-10-CM | POA: Diagnosis not present

## 2023-02-05 DIAGNOSIS — E876 Hypokalemia: Secondary | ICD-10-CM | POA: Diagnosis present

## 2023-02-05 DIAGNOSIS — K859 Acute pancreatitis without necrosis or infection, unspecified: Secondary | ICD-10-CM | POA: Diagnosis not present

## 2023-02-05 DIAGNOSIS — F1010 Alcohol abuse, uncomplicated: Secondary | ICD-10-CM

## 2023-02-05 DIAGNOSIS — Z888 Allergy status to other drugs, medicaments and biological substances status: Secondary | ICD-10-CM

## 2023-02-05 DIAGNOSIS — Z881 Allergy status to other antibiotic agents status: Secondary | ICD-10-CM

## 2023-02-05 DIAGNOSIS — R911 Solitary pulmonary nodule: Secondary | ICD-10-CM | POA: Diagnosis present

## 2023-02-05 DIAGNOSIS — D539 Nutritional anemia, unspecified: Secondary | ICD-10-CM | POA: Diagnosis present

## 2023-02-05 DIAGNOSIS — Z825 Family history of asthma and other chronic lower respiratory diseases: Secondary | ICD-10-CM

## 2023-02-05 DIAGNOSIS — Z821 Family history of blindness and visual loss: Secondary | ICD-10-CM

## 2023-02-05 DIAGNOSIS — Z8261 Family history of arthritis: Secondary | ICD-10-CM

## 2023-02-05 DIAGNOSIS — R059 Cough, unspecified: Secondary | ICD-10-CM | POA: Diagnosis not present

## 2023-02-05 DIAGNOSIS — R748 Abnormal levels of other serum enzymes: Secondary | ICD-10-CM | POA: Diagnosis present

## 2023-02-05 DIAGNOSIS — Z90710 Acquired absence of both cervix and uterus: Secondary | ICD-10-CM

## 2023-02-05 DIAGNOSIS — Z8249 Family history of ischemic heart disease and other diseases of the circulatory system: Secondary | ICD-10-CM

## 2023-02-05 HISTORY — DX: Alcohol dependence, uncomplicated: F10.20

## 2023-02-05 LAB — COMPREHENSIVE METABOLIC PANEL
ALT: 29 U/L (ref 0–44)
AST: 43 U/L — ABNORMAL HIGH (ref 15–41)
Albumin: 3.6 g/dL (ref 3.5–5.0)
Alkaline Phosphatase: 67 U/L (ref 38–126)
Anion gap: 15 (ref 5–15)
BUN: 6 mg/dL (ref 6–20)
CO2: 20 mmol/L — ABNORMAL LOW (ref 22–32)
Calcium: 9.1 mg/dL (ref 8.9–10.3)
Chloride: 104 mmol/L (ref 98–111)
Creatinine, Ser: 0.58 mg/dL (ref 0.44–1.00)
GFR, Estimated: >60 mL/min (ref >60)
Glucose, Bld: 145 mg/dL — ABNORMAL HIGH (ref 70–99)
Potassium: 3.2 mmol/L — ABNORMAL LOW (ref 3.5–5.1)
Sodium: 139 mmol/L (ref 135–145)
Total Bilirubin: 1.2 mg/dL (ref 0.3–1.2)
Total Protein: 7 g/dL (ref 6.5–8.1)

## 2023-02-05 LAB — LIPASE, BLOOD: Lipase: 519 U/L — ABNORMAL HIGH (ref 11–51)

## 2023-02-05 LAB — CBC WITH DIFFERENTIAL/PLATELET
Abs Immature Granulocytes: 0.04 10*3/uL (ref 0.00–0.07)
Basophils Absolute: 0 10*3/uL (ref 0.0–0.1)
Basophils Relative: 0 %
Eosinophils Absolute: 0.1 10*3/uL (ref 0.0–0.5)
Eosinophils Relative: 1 %
HCT: 43.5 % (ref 36.0–46.0)
Hemoglobin: 15.2 g/dL — ABNORMAL HIGH (ref 12.0–15.0)
Immature Granulocytes: 0 %
Lymphocytes Relative: 15 %
Lymphs Abs: 1.9 10*3/uL (ref 0.7–4.0)
MCH: 35.8 pg — ABNORMAL HIGH (ref 26.0–34.0)
MCHC: 34.9 g/dL (ref 30.0–36.0)
MCV: 102.4 fL — ABNORMAL HIGH (ref 80.0–100.0)
Monocytes Absolute: 0.8 10*3/uL (ref 0.1–1.0)
Monocytes Relative: 6 %
Neutro Abs: 10 10*3/uL — ABNORMAL HIGH (ref 1.7–7.7)
Neutrophils Relative %: 78 %
Platelets: 334 10*3/uL (ref 150–400)
RBC: 4.25 MIL/uL (ref 3.87–5.11)
RDW: 11.8 % (ref 11.5–15.5)
WBC: 12.9 10*3/uL — ABNORMAL HIGH (ref 4.0–10.5)
nRBC: 0 % (ref 0.0–0.2)

## 2023-02-05 LAB — CBC
HCT: 40.1 % (ref 36.0–46.0)
Hemoglobin: 13.5 g/dL (ref 12.0–15.0)
MCH: 35.3 pg — ABNORMAL HIGH (ref 26.0–34.0)
MCHC: 33.7 g/dL (ref 30.0–36.0)
MCV: 105 fL — ABNORMAL HIGH (ref 80.0–100.0)
Platelets: 271 10*3/uL (ref 150–400)
RBC: 3.82 MIL/uL — ABNORMAL LOW (ref 3.87–5.11)
RDW: 11.9 % (ref 11.5–15.5)
WBC: 13.2 10*3/uL — ABNORMAL HIGH (ref 4.0–10.5)
nRBC: 0 % (ref 0.0–0.2)

## 2023-02-05 LAB — ETHANOL: Alcohol, Ethyl (B): <10 mg/dL (ref <10)

## 2023-02-05 LAB — CREATININE, SERUM
Creatinine, Ser: 0.54 mg/dL (ref 0.44–1.00)
GFR, Estimated: >60 mL/min (ref >60)

## 2023-02-05 LAB — MAGNESIUM: Magnesium: 1.8 mg/dL (ref 1.7–2.4)

## 2023-02-05 MED ORDER — THIAMINE HCL 100 MG PO TABS
250.0000 mg | ORAL_TABLET | Freq: Every day | ORAL | Status: DC
  Administered 2023-02-06 – 2023-02-13 (×8): 250 mg via ORAL
  Filled 2023-02-05 (×8): qty 3

## 2023-02-05 MED ORDER — POTASSIUM CHLORIDE 10 MEQ/100ML IV SOLN
10.0000 meq | Freq: Once | INTRAVENOUS | Status: AC
  Administered 2023-02-05: 10 meq via INTRAVENOUS
  Filled 2023-02-05: qty 100

## 2023-02-05 MED ORDER — ACETAMINOPHEN 325 MG PO TABS: 650.0000 mg | ORAL_TABLET | Freq: Four times a day (QID) | ORAL | Status: DC | PRN

## 2023-02-05 MED ORDER — BENZONATATE 100 MG PO CAPS
100.0000 mg | ORAL_CAPSULE | Freq: Three times a day (TID) | ORAL | Status: DC | PRN
  Administered 2023-02-10 (×3): 100 mg via ORAL
  Filled 2023-02-05 (×4): qty 1

## 2023-02-05 MED ORDER — ADULT MULTIVITAMIN W/MINERALS CH
1.0000 | ORAL_TABLET | Freq: Every day | ORAL | Status: DC
  Administered 2023-02-05 – 2023-02-13 (×9): 1 via ORAL
  Filled 2023-02-05 (×9): qty 1

## 2023-02-05 MED ORDER — HYDROMORPHONE HCL 1 MG/ML IJ SOLN
1.0000 mg | Freq: Once | INTRAMUSCULAR | Status: AC
  Administered 2023-02-05: 1 mg via INTRAVENOUS
  Filled 2023-02-05: qty 1

## 2023-02-05 MED ORDER — VITAMIN B-12 1000 MCG PO TABS
1000.0000 ug | ORAL_TABLET | Freq: Every day | ORAL | Status: DC
  Administered 2023-02-06 – 2023-02-13 (×8): 1000 ug via ORAL
  Filled 2023-02-05 (×8): qty 1

## 2023-02-05 MED ORDER — IOHEXOL 300 MG/ML  SOLN
100.0000 mL | Freq: Once | INTRAMUSCULAR | Status: AC | PRN
  Administered 2023-02-05: 100 mL via INTRAVENOUS

## 2023-02-05 MED ORDER — SODIUM CHLORIDE 0.9 % IV SOLN: INTRAVENOUS | Status: DC

## 2023-02-05 MED ORDER — HYDROMORPHONE HCL 1 MG/ML IJ SOLN
1.0000 mg | INTRAMUSCULAR | Status: DC | PRN
  Administered 2023-02-05 – 2023-02-09 (×15): 1 mg via INTRAVENOUS
  Filled 2023-02-05 (×15): qty 1

## 2023-02-05 MED ORDER — TRAZODONE HCL 50 MG PO TABS
25.0000 mg | ORAL_TABLET | Freq: Every evening | ORAL | Status: DC | PRN
  Administered 2023-02-09 – 2023-02-10 (×2): 25 mg via ORAL
  Filled 2023-02-05 (×3): qty 1

## 2023-02-05 MED ORDER — LOPERAMIDE HCL 2 MG PO CAPS: 2.0000 mg | ORAL_CAPSULE | ORAL | Status: AC | PRN

## 2023-02-05 MED ORDER — PANTOPRAZOLE SODIUM 40 MG PO TBEC: 40.0000 mg | DELAYED_RELEASE_TABLET | Freq: Every day | ORAL | Status: DC

## 2023-02-05 MED ORDER — ONDANSETRON HCL 4 MG/2ML IJ SOLN
4.0000 mg | Freq: Once | INTRAMUSCULAR | Status: AC | PRN
  Administered 2023-02-05: 4 mg via INTRAVENOUS
  Filled 2023-02-05: qty 2

## 2023-02-05 MED ORDER — SODIUM CHLORIDE 0.9 % IV BOLUS
1000.0000 mL | Freq: Once | INTRAVENOUS | Status: AC
  Administered 2023-02-05: 1000 mL via INTRAVENOUS

## 2023-02-05 MED ORDER — CHLORDIAZEPOXIDE HCL 25 MG PO CAPS
25.0000 mg | ORAL_CAPSULE | ORAL | Status: AC
  Administered 2023-02-07 – 2023-02-08 (×2): 25 mg via ORAL
  Filled 2023-02-05 (×2): qty 1

## 2023-02-05 MED ORDER — ONDANSETRON HCL 4 MG/2ML IJ SOLN
4.0000 mg | Freq: Once | INTRAMUSCULAR | Status: AC
  Administered 2023-02-05: 4 mg via INTRAVENOUS
  Filled 2023-02-05: qty 2

## 2023-02-05 MED ORDER — PANTOPRAZOLE SODIUM 40 MG IV SOLR
40.0000 mg | Freq: Once | INTRAVENOUS | Status: AC
  Administered 2023-02-05: 40 mg via INTRAVENOUS
  Filled 2023-02-05: qty 10

## 2023-02-05 MED ORDER — CHLORDIAZEPOXIDE HCL 25 MG PO CAPS
25.0000 mg | ORAL_CAPSULE | Freq: Four times a day (QID) | ORAL | Status: AC
  Administered 2023-02-05 – 2023-02-06 (×6): 25 mg via ORAL
  Filled 2023-02-05 (×5): qty 1

## 2023-02-05 MED ORDER — HYDRALAZINE HCL 20 MG/ML IJ SOLN: 5.0000 mg | INTRAMUSCULAR | Status: DC | PRN

## 2023-02-05 MED ORDER — OXYCODONE HCL 5 MG PO TABS
10.0000 mg | ORAL_TABLET | Freq: Once | ORAL | Status: AC
  Administered 2023-02-05: 10 mg via ORAL
  Filled 2023-02-05: qty 2

## 2023-02-05 MED ORDER — ONDANSETRON HCL 4 MG PO TABS: 4.0000 mg | ORAL_TABLET | Freq: Four times a day (QID) | ORAL | Status: DC | PRN

## 2023-02-05 MED ORDER — ACETAMINOPHEN 650 MG RE SUPP: 650.0000 mg | Freq: Four times a day (QID) | RECTAL | Status: DC | PRN

## 2023-02-05 MED ORDER — HYDROXYZINE HCL 25 MG PO TABS: 25.0000 mg | ORAL_TABLET | Freq: Four times a day (QID) | ORAL | Status: AC | PRN

## 2023-02-05 MED ORDER — OXYCODONE HCL 5 MG PO TABS
10.0000 mg | ORAL_TABLET | ORAL | Status: DC | PRN
  Administered 2023-02-06 – 2023-02-13 (×17): 10 mg via ORAL
  Filled 2023-02-05 (×17): qty 2

## 2023-02-05 MED ORDER — NICOTINE 14 MG/24HR TD PT24
14.0000 mg | MEDICATED_PATCH | Freq: Every day | TRANSDERMAL | Status: DC
  Administered 2023-02-05 – 2023-02-13 (×9): 14 mg via TRANSDERMAL
  Filled 2023-02-05 (×9): qty 1

## 2023-02-05 MED ORDER — PANTOPRAZOLE SODIUM 40 MG IV SOLR
40.0000 mg | Freq: Every day | INTRAVENOUS | Status: DC
  Administered 2023-02-05 – 2023-02-11 (×7): 40 mg via INTRAVENOUS
  Filled 2023-02-05 (×7): qty 10

## 2023-02-05 MED ORDER — CHLORDIAZEPOXIDE HCL 25 MG PO CAPS
25.0000 mg | ORAL_CAPSULE | Freq: Every day | ORAL | Status: AC
  Administered 2023-02-08: 25 mg via ORAL
  Filled 2023-02-05: qty 1

## 2023-02-05 MED ORDER — LORAZEPAM 1 MG PO TABS: 1.0000 mg | ORAL_TABLET | ORAL | Status: AC | PRN

## 2023-02-05 MED ORDER — ALBUTEROL SULFATE (2.5 MG/3ML) 0.083% IN NEBU: 2.5000 mg | INHALATION_SOLUTION | RESPIRATORY_TRACT | Status: DC | PRN

## 2023-02-05 MED ORDER — CHLORDIAZEPOXIDE HCL 25 MG PO CAPS
25.0000 mg | ORAL_CAPSULE | Freq: Three times a day (TID) | ORAL | Status: AC
  Administered 2023-02-07 (×2): 25 mg via ORAL
  Filled 2023-02-05 (×3): qty 1

## 2023-02-05 MED ORDER — ENOXAPARIN SODIUM 40 MG/0.4ML IJ SOSY
40.0000 mg | PREFILLED_SYRINGE | INTRAMUSCULAR | Status: DC
  Administered 2023-02-05 – 2023-02-12 (×8): 40 mg via SUBCUTANEOUS
  Filled 2023-02-05 (×8): qty 0.4

## 2023-02-05 MED ORDER — FOLIC ACID 1 MG PO TABS
1.0000 mg | ORAL_TABLET | Freq: Every day | ORAL | Status: DC
  Administered 2023-02-05 – 2023-02-13 (×9): 1 mg via ORAL
  Filled 2023-02-05 (×9): qty 1

## 2023-02-05 MED ORDER — ONDANSETRON HCL 4 MG/2ML IJ SOLN: 4.0000 mg | Freq: Four times a day (QID) | INTRAMUSCULAR | Status: DC | PRN

## 2023-02-05 MED ORDER — POTASSIUM CHLORIDE CRYS ER 20 MEQ PO TBCR
40.0000 meq | EXTENDED_RELEASE_TABLET | Freq: Once | ORAL | Status: AC
  Administered 2023-02-05: 40 meq via ORAL
  Filled 2023-02-05: qty 2

## 2023-02-05 NOTE — ED Notes (Signed)
ED TO INPATIENT HANDOFF REPORT  ED Nurse Name and Phone #: Ronalee Belts [phone]  S Name/Age/Gender Deborah Peterson 57 y.o. female Room/Bed: WA12/WA12  Code Status   Code Status: Full Code  Home/SNF/Other Home Patient oriented to: self, place, time, and situation Is this baseline? Yes   Triage Complete: Triage complete  Chief Complaint Acute pancreatitis [K85.90]  Triage Note Pt arrived from home BIB [facility] for abd pain mid epigastric pain since Tuesday, started vomiting on Thursday. Hx of gastric ulcers, takes Protonix. Pt admits to alcohol assumption  last drink was last night 10 pm. VSS, NAD noted. A&O x4.    Allergies Allergies  Allergen Reactions   Erythromycin Anaphylaxis   Motrin [Ibuprofen]    Penicillins    Tramadol    Ciprofloxacin Itching    Level of Care/Admitting Diagnosis ED Disposition     ED Disposition  Admit   Condition  --   Comment  Hospital Area: [place] [[id number]]  Level of Care: Med-Surg [16]  May place patient in observation at [facility] or [facility] if equivalent level of care is available:: Yes  Covid Evaluation: Asymptomatic - no recent exposure (last 10 days) testing not required  Diagnosis: Acute pancreatitis [577.0.ICD-9-CM]  Admitting Physician: Lucillie Garfinkel [id number]  Attending Physician: Hollice Gong, MIR Kari.[place]ine [id number]          B Medical/Surgery History Past Medical History:  Diagnosis Date   Alcoholism ([place])    [place]   GERD (gastroesophageal reflux disease) 2017   Ulcer 2019   gastric ulcers   Past Surgical History:  Procedure Laterality Date   ABDOMINAL HYSTERECTOMY     CESAREAN SECTION     TUBAL LIGATION  1996     A IV Location/Drains/Wounds Patient Lines/Drains/Airways Status     Active Line/Drains/Airways     Name Placement date Placement time Site Days   Peripheral IV 02/05/23 20 G Left Antecubital 02/05/23  0700  Antecubital  less than 1             Intake/Output Last 24 hours No intake or output data in the 24 hours ending 02/05/23 1527  Labs/Imaging Results for orders placed or performed during the hospital encounter of 02/05/23 (from the past 48 hour(s))  Comprehensive metabolic panel     Status: Abnormal   Collection Time: 02/05/23  6:58 AM  Result Value Ref Range   Sodium 139 135 - 145 mmol/L   Potassium 3.2 (L) 3.5 - 5.1 mmol/L   Chloride 104 98 - 111 mmol/L   CO2 20 (L) 22 - 32 mmol/L   Glucose, Bld 145 (H) 70 - 99 mg/dL    Comment: Glucose reference range applies only to samples taken after fasting for at least 8 hours.   BUN 6 6 - 20 mg/dL   Creatinine, Ser 0.58 0.44 - 1.00 mg/dL   Calcium 9.1 8.9 - 10.3 mg/dL   Total Protein 7.0 6.5 - 8.1 g/dL   Albumin 3.6 3.5 - 5.0 g/dL   AST 43 (H) 15 - 41 U/L   ALT 29 0 - 44 U/L   Alkaline Phosphatase 67 38 - 126 U/L   Total Bilirubin 1.2 0.3 - 1.2 mg/dL   GFR, Estimated >60 >60 mL/min    Comment: (NOTE) Calculated using the CKD-EPI Creatinine Equation (2021)    Anion gap 15 5 - 15    Comment: Performed at [facility], [place] [address], [place], [place] [address]  Lipase, blood     Status: Abnormal   Collection Time: 02/05/23  6:58 AM  Result Value Ref Range   Lipase 519 (H) 11 - 51 U/L    Comment: RESULT CONFIRMED BY MANUAL DILUTION Performed at [facility], [place] [address], [place], [place] [address]   CBC with Diff     Status: Abnormal   Collection Time: 02/05/23  6:58 AM  Result Value Ref Range   WBC 12.9 (H) 4.0 - 10.5 K/uL   RBC 4.25 3.87 - 5.11 MIL/uL   Hemoglobin 15.2 (H) 12.0 - 15.0 g/dL   HCT 43.5 36.0 - 46.0 %   MCV 102.4 (H) 80.0 - 100.0 fL   MCH 35.8 (H) 26.0 - 34.0 pg   MCHC 34.9 30.0 - 36.0 g/dL   RDW 11.8 11.5 - 15.5 %   Platelets 334 150 - 400 K/uL   nRBC 0.0 0.0 - 0.2 %   Neutrophils Relative % 78 %   Neutro Abs 10.0 (H) 1.7 - 7.7 K/uL   Lymphocytes Relative 15 %   Lymphs Abs 1.9 0.7 - 4.0 K/uL    Monocytes Relative 6 %   Monocytes Absolute 0.8 0.1 - 1.0 K/uL   Eosinophils Relative 1 %   Eosinophils Absolute 0.1 0.0 - 0.5 K/uL   Basophils Relative 0 %   Basophils Absolute 0.0 0.0 - 0.1 K/uL   Immature Granulocytes 0 %   Abs Immature Granulocytes 0.04 0.00 - 0.07 K/uL    Comment: Performed at [facility], [place] [address], [place], [place] [address]  Ethanol     Status: None   Collection Time: 02/05/23  6:58 AM  Result Value Ref Range   Alcohol, Ethyl (B) <10 <10 mg/dL    Comment: (NOTE) Lowest detectable limit for serum alcohol is 10 mg/dL.  For medical purposes only. Performed at [facility], [place] [address] [place], [place] [address]   Magnesium     Status: None   Collection Time: 02/05/23  6:58 AM  Result Value Ref Range   Magnesium 1.8 1.7 - 2.4 mg/dL    Comment: Performed at [facility], [place] [address], [place], [place] [address]   CT ABDOMEN PELVIS W CONTRAST  Result Date: 02/05/2023 CLINICAL DATA:  Pancreatitis, acute and severe. EXAM: CT ABDOMEN AND PELVIS WITH CONTRAST TECHNIQUE: Multidetector CT imaging of the abdomen and pelvis was performed using the standard protocol following bolus administration of intravenous contrast. RADIATION DOSE REDUCTION: This exam was performed according to the departmental dose-optimization program which includes automated exposure control, adjustment of the mA and/or kV according to patient size and/or use of iterative reconstruction technique. CONTRAST:  166mL OMNIPAQUE IOHEXOL 300 MG/ML  SOLN COMPARISON:  None Available. FINDINGS: Lower chest: 7 mm left lower lobe lung nodule identified, image 5/6. No pleural fluid or consolidation. Hepatobiliary: No focal liver abnormality is seen. No gallstones, gallbladder wall thickening, or biliary dilatation. Pancreas: There is pancreatic edema with diffuse peripancreatic fat stranding compatible with the history of acute pancreatitis.  Calcifications identified within the uncinate process of the pancreas. No signs of pancreatic necrosis. No focal fluid collections identified. Spleen: Normal in size without focal abnormality. Adrenals/Urinary Tract: Normal adrenal glands. No kidney mass or hydronephrosis. No nephrolithiasis. Urinary bladder is unremarkable. Stomach/Bowel: Small hiatal hernia. No pathologic dilatation of the large or small bowel loops. There is diffuse intramural fatty deposition up to the level of the distal descending colon. The colon appears diffusely decompressed with  mild diffuse wall thickening. No pericolonic fat stranding or signs of pneumatosis. Extensive sigmoid diverticulosis without signs of acute diverticulitis. Vascular/Lymphatic: Extensive aortic atherosclerosis. Upper abdominal vascularity remains patent. No signs of abdominopelvic adenopathy. Reproductive: Status post hysterectomy. No adnexal masses. Other: There is a small amount of fluid surrounding the head and uncinate process of pancreas. No discrete fluid collections identified within the abdomen or pelvis. No signs of pneumoperitoneum. Musculoskeletal: No acute or significant osseous findings. IMPRESSION: 1. Acute pancreatitis. No signs of pancreatic necrosis or focal fluid collections identified. 2. Diffuse intramural fatty deposition within the colon up to the level of the distal descending colon. This is a nonspecific finding but can be seen in the setting of chronic inflammation. 3. Extensive sigmoid diverticulosis without signs of acute diverticulitis. 4. Left solid pulmonary nodule measuring 7 mm. Per Fleischner Society Guidelines, recommend a non-contrast Chest CT at 6-12 months. If patient is high risk for malignancy, consider an additional non-contrast Chest CT at 18-24 months. If patient is low risk for malignancy, non-contrast Chest CT at 18-24 months is optional. These guidelines do not apply to immunocompromised patients and patients with cancer.  Follow up in patients with significant comorbidities as clinically warranted. For lung cancer screening, adhere to Lung-RADS guidelines. Reference: Radiology. 2017; 284(1):228-43. 5. Small hiatal hernia. 6.  Aortic Atherosclerosis (ICD10-I70.0). Electronically Signed   By: Kerby Moors M.D.   On: 02/05/2023 11:04    Pending Labs Unresulted Labs (From admission, onward)     Start     Ordered   02/12/23 0500  Creatinine, serum  (enoxaparin (LOVENOX)    CrCl >/= 30 ml/min)  Weekly,   R     Comments: while on enoxaparin therapy    02/05/23 1510   02/06/23 0500  Comprehensive metabolic panel  Tomorrow morning,   R        02/05/23 1510   02/06/23 0500  CBC  Tomorrow morning,   R        02/05/23 1510   02/05/23 1507  HIV Antibody (routine testing w rflx)  (HIV Antibody (Routine testing w reflex) panel)  Once,   R        02/05/23 1510   02/05/23 1507  CBC  (enoxaparin (LOVENOX)    CrCl >/= 30 ml/min)  Once,   R       Comments: Baseline for enoxaparin therapy IF NOT ALREADY DRAWN.  Notify MD if PLT < 100 K.    02/05/23 1510   02/05/23 1507  Creatinine, serum  (enoxaparin (LOVENOX)    CrCl >/= 30 ml/min)  Once,   R       Comments: Baseline for enoxaparin therapy IF NOT ALREADY DRAWN.    02/05/23 1510            Vitals/Pain Today's Vitals   02/05/23 1140 02/05/23 1300 02/05/23 1345 02/05/23 1430  BP: (!) 160/72 (!) 155/85  (!) 146/66  Pulse: 85 92  86  Resp: 16 16  16   Temp:   97.6 F (36.4 C)   TempSrc:   Oral   SpO2: 100% 93%  97%  Weight:      Height:      PainSc:        Isolation Precautions No active isolations  Medications Medications  pantoprazole (PROTONIX) EC tablet 40 mg (has no administration in time range)  cyanocobalamin (VITAMIN B12) tablet 1,000 mcg (has no administration in time range)  thiamine (VITAMIN B1) tablet 250 mg (has no administration in time range)  benzonatate (TESSALON) capsule 100 mg (has no administration in time range)  enoxaparin  (LOVENOX) injection 40 mg (has no administration in time range)  acetaminophen (TYLENOL) tablet 650 mg (has no administration in time range)    Or  acetaminophen (TYLENOL) suppository 650 mg (has no administration in time range)  traZODone (DESYREL) tablet 25 mg (has no administration in time range)  ondansetron (ZOFRAN) injection 4 mg (has no administration in time range)  albuterol (PROVENTIL) (2.5 MG/3ML) 0.083% nebulizer solution 2.5 mg (has no administration in time range)  hydrALAZINE (APRESOLINE) injection 5 mg (has no administration in time range)  potassium chloride SA (KLOR-CON M) CR tablet 40 mEq (has no administration in time range)  0.9 %  sodium chloride infusion (has no administration in time range)  hydrOXYzine (ATARAX) tablet 25 mg (has no administration in time range)  loperamide (IMODIUM) capsule 2-4 mg (has no administration in time range)  chlordiazePOXIDE (LIBRIUM) capsule 25 mg (has no administration in time range)    Followed by  chlordiazePOXIDE (LIBRIUM) capsule 25 mg (has no administration in time range)    Followed by  chlordiazePOXIDE (LIBRIUM) capsule 25 mg (has no administration in time range)    Followed by  chlordiazePOXIDE (LIBRIUM) capsule 25 mg (has no administration in time range)  LORazepam (ATIVAN) tablet 1-4 mg (has no administration in time range)  folic acid (FOLVITE) tablet 1 mg (has no administration in time range)  multivitamin with minerals tablet 1 tablet (has no administration in time range)  sodium chloride 0.9 % bolus 1,000 mL (0 mLs Intravenous Stopped 02/05/23 1104)  ondansetron (ZOFRAN) injection 4 mg (4 mg Intravenous Given 02/05/23 0718)  HYDROmorphone (DILAUDID) injection 1 mg (1 mg Intravenous Given 02/05/23 0736)  HYDROmorphone (DILAUDID) injection 1 mg (1 mg Intravenous Given 02/05/23 0951)  ondansetron (ZOFRAN) injection 4 mg (4 mg Intravenous Given 02/05/23 0952)  pantoprazole (PROTONIX) injection 40 mg (40 mg Intravenous Given  02/05/23 0952)  iohexol (OMNIPAQUE) 300 MG/ML solution 100 mL (100 mLs Intravenous Contrast Given 02/05/23 1023)  potassium chloride 10 mEq in 100 mL IVPB (0 mEq Intravenous Stopped 02/05/23 1238)  oxyCODONE (Oxy IR/ROXICODONE) immediate release tablet 10 mg (10 mg Oral Given 02/05/23 1247)    Mobility walks     Focused Assessments Ab pain   R Recommendations: See Admitting Provider Note  Report given to:   Additional Notes: .

## 2023-02-05 NOTE — H&P (Signed)
History and Physical  Deborah Peterson [id number] DOB: 1965/12/12 DOA: 02/05/2023  PCP: Charyl Dancer, NP   Chief Complaint: abd pain  HPI: Deborah Peterson is a 57 y.o. female with medical history significant for GERD and daily alcohol use admitted with 3-4 days of abd pain. It started as epigastic pain 3-4 days ago that felt like heartburn and seemed to get better after about 12 hours and then since 2 days ago became constant. In the last 24 hours she has developed nausea and vomiting. No fevers, no prior history of pancreatitis. No chest pain. No hematemesis. Last drink of alcohol 3/29 at 10 PM.   ED Course: On evaluation in the ER, 168/75 and lab work revealed wbc 12.9 hb 15, k 3.2. She has been hemodynamically stable and Lipase 519. CT with evidence of pancreatitis without complication.  Review of Systems: Please see HPI for pertinent positives and negatives. A complete 10 system review of systems are otherwise negative.  Past Medical History:  Diagnosis Date   Alcoholism [facility])    Allergy 1985   Anxiety 1970   GERD (gastroesophageal reflux disease) 2017   Ulcer 2019   gastric ulcers   Past Surgical History:  Procedure Laterality Date   ABDOMINAL HYSTERECTOMY     CESAREAN SECTION     TUBAL LIGATION  1996    Social History:  reports that she has been smoking cigarettes. She has a 10.00 pack-year smoking history. She has never used smokeless tobacco. She reports current alcohol use of about 6.0 standard drinks of alcohol per week. She reports that she does not use drugs.   Allergies  Allergen Reactions   Erythromycin Anaphylaxis   Motrin [Ibuprofen]    Penicillins    Tramadol    Ciprofloxacin Itching    Family History  Problem Relation Age of Onset   Cancer Mother        breast   Alzheimer's disease Mother    Heart disease Father    ADD / ADHD Son    Arthritis Maternal Grandmother    Vision loss Maternal Grandmother    Cancer Paternal Grandmother         ovarian   COPD Paternal Grandmother    Vision loss Paternal Grandmother    Heart disease Paternal Grandfather      Prior to Admission medications   Medication Sig Start Date End Date Taking? Authorizing Provider  acetaminophen (TYLENOL) 500 MG tablet Take by mouth.    [provider]  benzonatate (TESSALON) 100 MG capsule Take 1 capsule (100 mg total) by mouth 3 (three) times daily as needed. 04/09/22   Mar Daring, PA-C  cyanocobalamin 1000 MCG tablet Take 1,000 mcg by mouth daily.    [provider]  pantoprazole (PROTONIX) 40 MG tablet Take 40 mg by mouth daily. 01/26/22   [provider]  thiamine 250 MG tablet Take 250 mg by mouth daily.    [provider]  Vitamin D, Ergocalciferol, (DRISDOL) 1.25 MG (50000 UNIT) CAPS capsule Take 1 capsule (50,000 Units total) by mouth every 7 (seven) days. 03/04/22   McElwee, Scheryl Darter, NP    Physical Exam: BP (!) 146/66   Pulse 86   Temp 97.6 F (36.4 C) (Oral)   Resp 16   Ht 5\' 1"  (1.549 m)   Wt 49.9 kg   SpO2 97%   BMI 20.78 kg/m   General:  Alert, oriented, calm, in no acute distress looks comfortable Eyes: EOMI, clear conjuctivae, white  sclerea Neck: supple, no masses, trachea mildline  Cardiovascular: RRR, no murmurs or rubs, no peripheral edema  Respiratory: clear to auscultation bilaterally, no wheezes, no crackles  Abdomen: soft, epigastrum tender, nondistended, no bowel tones heard  Skin: dry, no rashes  Musculoskeletal: no joint effusions, normal range of motion  Psychiatric: appropriate affect, normal speech  Neurologic: extraocular muscles intact, clear speech, moving all extremities with intact sensorium          Labs on Admission:  Basic Metabolic Panel: Recent Labs  Lab 02/05/23 0658  NA 139  K 3.2*  CL 104  CO2 20*  GLUCOSE 145*  BUN 6  CREATININE 0.58  CALCIUM 9.1  MG 1.8   Liver Function Tests: Recent Labs  Lab 02/05/23 0658  AST 43*  ALT 29  ALKPHOS  67  BILITOT 1.2  PROT 7.0  ALBUMIN 3.6   Recent Labs  Lab 02/05/23 0658  LIPASE 519*   No results for input(s): "AMMONIA" in the last 168 hours. CBC: Recent Labs  Lab 02/05/23 0658  WBC 12.9*  NEUTROABS 10.0*  HGB 15.2*  HCT 43.5  MCV 102.4*  PLT 334   Cardiac Enzymes: No results for input(s): "CKTOTAL", "CKMB", "CKMBINDEX", "TROPONINI" in the last 168 hours.  BNP (last 3 results) No results for input(s): "BNP" in the last 8760 hours.  ProBNP (last 3 results) No results for input(s): "PROBNP" in the last 8760 hours.  CBG: No results for input(s): "GLUCAP" in the last 168 hours.  Radiological Exams on Admission: CT ABDOMEN PELVIS W CONTRAST  Result Date: 02/05/2023 CLINICAL DATA:  Pancreatitis, acute and severe. EXAM: CT ABDOMEN AND PELVIS WITH CONTRAST TECHNIQUE: Multidetector CT imaging of the abdomen and pelvis was performed using the standard protocol following bolus administration of intravenous contrast. RADIATION DOSE REDUCTION: This exam was performed according to the departmental dose-optimization program which includes automated exposure control, adjustment of the mA and/or kV according to patient size and/or use of iterative reconstruction technique. CONTRAST:  17mL OMNIPAQUE IOHEXOL 300 MG/ML  SOLN COMPARISON:  None Available. FINDINGS: Lower chest: 7 mm left lower lobe lung nodule identified, image 5/6. No pleural fluid or consolidation. Hepatobiliary: No focal liver abnormality is seen. No gallstones, gallbladder wall thickening, or biliary dilatation. Pancreas: There is pancreatic edema with diffuse peripancreatic fat stranding compatible with the history of acute pancreatitis. Calcifications identified within the uncinate process of the pancreas. No signs of pancreatic necrosis. No focal fluid collections identified. Spleen: Normal in size without focal abnormality. Adrenals/Urinary Tract: Normal adrenal glands. No kidney mass or hydronephrosis. No  nephrolithiasis. Urinary bladder is unremarkable. Stomach/Bowel: Small hiatal hernia. No pathologic dilatation of the large or small bowel loops. There is diffuse intramural fatty deposition up to the level of the distal descending colon. The colon appears diffusely decompressed with mild diffuse wall thickening. No pericolonic fat stranding or signs of pneumatosis. Extensive sigmoid diverticulosis without signs of acute diverticulitis. Vascular/Lymphatic: Extensive aortic atherosclerosis. Upper abdominal vascularity remains patent. No signs of abdominopelvic adenopathy. Reproductive: Status post hysterectomy. No adnexal masses. Other: There is a small amount of fluid surrounding the head and uncinate process of pancreas. No discrete fluid collections identified within the abdomen or pelvis. No signs of pneumoperitoneum. Musculoskeletal: No acute or significant osseous findings. IMPRESSION: 1. Acute pancreatitis. No signs of pancreatic necrosis or focal fluid collections identified. 2. Diffuse intramural fatty deposition within the colon up to the level of the distal descending colon. This is a nonspecific finding but can be seen in the  setting of chronic inflammation. 3. Extensive sigmoid diverticulosis without signs of acute diverticulitis. 4. Left solid pulmonary nodule measuring 7 mm. Per Fleischner Society Guidelines, recommend a non-contrast Chest CT at 6-12 months. If patient is high risk for malignancy, consider an additional non-contrast Chest CT at 18-24 months. If patient is low risk for malignancy, non-contrast Chest CT at 18-24 months is optional. These guidelines do not apply to immunocompromised patients and patients with cancer. Follow up in patients with significant comorbidities as clinically warranted. For lung cancer screening, adhere to Lung-RADS guidelines. Reference: Radiology. 2017; 284(1):228-43. 5. Small hiatal hernia. 6.  Aortic Atherosclerosis (ICD10-I70.0). Electronically Signed   By:  Kerby Moors M.D.   On: 02/05/2023 11:04    Assessment/Plan Principal Problem:   Acute alcoholic pancreatitis - with abd pain, elevated lipase and imaging changing consistent with the same - admit to observation - NPO - IVF - pain and nausea medication PRN - check Triglycerides  Alcohol Abuse - daily heavy drinker, high risk for EtOH withdrawal - will start Librium bolus and taper - PRN ativan per CIWA protoco - Thiamine, Folate, MVI   Anxiety   Gastroesophageal reflux disease - IV PPI in the setting of nausea/vomiting   Hypokalemia - likely due to vomiting, repleted and will recheck in AM   Leukocytosis - likley due to vomiting, no evidence of active infection  DVT prophylaxis: Lovenox     Code Status: Full Code  Consults called: None  Admission status: Observation  Time spent: 45 minutes  Zarin Knupp Neva Seat MD [facility] Pager [phone]  If 7PM-7AM, please contact night-coverage [email or web address] Password [facility]  02/05/2023, 3:14 PM

## 2023-02-05 NOTE — ED Provider Notes (Signed)
[facility] Provider Note   CSN: [id number] Arrival date & time: 02/05/23  0544     History  Chief Complaint  Patient presents with   Abdominal Pain    Deborah Peterson is a 57 y.o. female with past medical history significant for GERD, alcohol abuse, PUD, tobacco use presents to the ED by EMS for mid epigastric abdominal pain that began on Wednesday.  Patient states she started vomiting Thursday and pain has been worsening over the last few days.  She has also had mild diarrhea.  Patient does have a history of gastric ulcers and takes Protonix daily.  Patient does admit to heavy alcohol consumption and states that she drinks "many beers".  Patient's last alcoholic drink was 10 PM last night.  Denies fever, chills, dysuria, chest pain, chest tightness, shortness of breath, abdominal distention, skin color change.       Home Medications Prior to Admission medications   Medication Sig Start Date End Date Taking? Authorizing Provider  acetaminophen (TYLENOL) 500 MG tablet Take by mouth.    [provider]  benzonatate (TESSALON) 100 MG capsule Take 1 capsule (100 mg total) by mouth 3 (three) times daily as needed. 04/09/22   Mar Daring, PA-C  cyanocobalamin 1000 MCG tablet Take 1,000 mcg by mouth daily.    [provider]  pantoprazole (PROTONIX) 40 MG tablet Take 40 mg by mouth daily. 01/26/22   [provider]  thiamine 250 MG tablet Take 250 mg by mouth daily.    [provider]  Vitamin D, Ergocalciferol, (DRISDOL) 1.25 MG (50000 UNIT) CAPS capsule Take 1 capsule (50,000 Units total) by mouth every 7 (seven) days. 03/04/22   McElwee, Lauren A, NP      Allergies    Erythromycin, Motrin [ibuprofen], Penicillins, Tramadol, and Ciprofloxacin    Review of Systems   Review of Systems  Constitutional:  Negative for chills and fever.  Respiratory:  Negative for chest tightness and shortness of  breath.   Cardiovascular:  Negative for chest pain.  Gastrointestinal:  Positive for abdominal pain, diarrhea, nausea and vomiting. Negative for abdominal distention.  Genitourinary:  Negative for dysuria.  Skin:  Negative for color change.    Physical Exam Updated Vital Signs BP (!) 146/66   Pulse 86   Temp 97.6 F (36.4 C) (Oral)   Resp 16   Ht 5\' 1"  (1.549 m)   Wt 49.9 kg   SpO2 97%   BMI 20.78 kg/m  Physical Exam Vitals and nursing note reviewed.  Constitutional:      General: She is in acute distress (patient doubled over in pain holding her abdomen).     Appearance: Normal appearance. She is ill-appearing. She is not diaphoretic.  Cardiovascular:     Rate and Rhythm: Normal rate and regular rhythm.  Pulmonary:     Effort: Pulmonary effort is normal.  Abdominal:     General: Abdomen is flat.     Palpations: Abdomen is soft.     Tenderness: There is abdominal tenderness in the right upper quadrant and epigastric area.  Skin:    General: Skin is warm and dry.     Capillary Refill: Capillary refill takes less than 2 seconds.     Coloration: Skin is not jaundiced.  Neurological:     Mental Status: She is alert. Mental status is at baseline.  Psychiatric:        Mood and Affect: Mood normal.  Behavior: Behavior normal.     ED Results / Procedures / Treatments   Labs (all labs ordered are listed, but only abnormal results are displayed) Labs Reviewed  COMPREHENSIVE METABOLIC PANEL - Abnormal; Notable for the following components:      Result Value   Potassium 3.2 (*)    CO2 20 (*)    Glucose, Bld 145 (*)    AST 43 (*)    All other components within normal limits  LIPASE, BLOOD - Abnormal; Notable for the following components:   Lipase 519 (*)    All other components within normal limits  CBC WITH DIFFERENTIAL/PLATELET - Abnormal; Notable for the following components:   WBC 12.9 (*)    Hemoglobin 15.2 (*)    MCV 102.4 (*)    MCH 35.8 (*)    Neutro  Abs 10.0 (*)    All other components within normal limits  ETHANOL  MAGNESIUM    EKG None  Radiology CT ABDOMEN PELVIS W CONTRAST  Result Date: 02/05/2023 CLINICAL DATA:  Pancreatitis, acute and severe. EXAM: CT ABDOMEN AND PELVIS WITH CONTRAST TECHNIQUE: Multidetector CT imaging of the abdomen and pelvis was performed using the standard protocol following bolus administration of intravenous contrast. RADIATION DOSE REDUCTION: This exam was performed according to the departmental dose-optimization program which includes automated exposure control, adjustment of the mA and/or kV according to patient size and/or use of iterative reconstruction technique. CONTRAST:  174mL OMNIPAQUE IOHEXOL 300 MG/ML  SOLN COMPARISON:  None Available. FINDINGS: Lower chest: 7 mm left lower lobe lung nodule identified, image 5/6. No pleural fluid or consolidation. Hepatobiliary: No focal liver abnormality is seen. No gallstones, gallbladder wall thickening, or biliary dilatation. Pancreas: There is pancreatic edema with diffuse peripancreatic fat stranding compatible with the history of acute pancreatitis. Calcifications identified within the uncinate process of the pancreas. No signs of pancreatic necrosis. No focal fluid collections identified. Spleen: Normal in size without focal abnormality. Adrenals/Urinary Tract: Normal adrenal glands. No kidney mass or hydronephrosis. No nephrolithiasis. Urinary bladder is unremarkable. Stomach/Bowel: Small hiatal hernia. No pathologic dilatation of the large or small bowel loops. There is diffuse intramural fatty deposition up to the level of the distal descending colon. The colon appears diffusely decompressed with mild diffuse wall thickening. No pericolonic fat stranding or signs of pneumatosis. Extensive sigmoid diverticulosis without signs of acute diverticulitis. Vascular/Lymphatic: Extensive aortic atherosclerosis. Upper abdominal vascularity remains patent. No signs of  abdominopelvic adenopathy. Reproductive: Status post hysterectomy. No adnexal masses. Other: There is a small amount of fluid surrounding the head and uncinate process of pancreas. No discrete fluid collections identified within the abdomen or pelvis. No signs of pneumoperitoneum. Musculoskeletal: No acute or significant osseous findings. IMPRESSION: 1. Acute pancreatitis. No signs of pancreatic necrosis or focal fluid collections identified. 2. Diffuse intramural fatty deposition within the colon up to the level of the distal descending colon. This is a nonspecific finding but can be seen in the setting of chronic inflammation. 3. Extensive sigmoid diverticulosis without signs of acute diverticulitis. 4. Left solid pulmonary nodule measuring 7 mm. Per Fleischner Society Guidelines, recommend a non-contrast Chest CT at 6-12 months. If patient is high risk for malignancy, consider an additional non-contrast Chest CT at 18-24 months. If patient is low risk for malignancy, non-contrast Chest CT at 18-24 months is optional. These guidelines do not apply to immunocompromised patients and patients with cancer. Follow up in patients with significant comorbidities as clinically warranted. For lung cancer screening, adhere to Lung-RADS guidelines. Reference:  Radiology. 2017; 284(1):228-43. 5. Small hiatal hernia. 6.  Aortic Atherosclerosis (ICD10-I70.0). Electronically Signed   By: Kerby Moors M.D.   On: 02/05/2023 11:04    Procedures Procedures    Medications Ordered in ED Medications  sodium chloride 0.9 % bolus 1,000 mL (0 mLs Intravenous Stopped 02/05/23 1104)  ondansetron (ZOFRAN) injection 4 mg (4 mg Intravenous Given 02/05/23 0718)  HYDROmorphone (DILAUDID) injection 1 mg (1 mg Intravenous Given 02/05/23 0736)  HYDROmorphone (DILAUDID) injection 1 mg (1 mg Intravenous Given 02/05/23 0951)  ondansetron (ZOFRAN) injection 4 mg (4 mg Intravenous Given 02/05/23 0952)  pantoprazole (PROTONIX) injection 40 mg (40  mg Intravenous Given 02/05/23 0952)  iohexol (OMNIPAQUE) 300 MG/ML solution 100 mL (100 mLs Intravenous Contrast Given 02/05/23 1023)  potassium chloride 10 mEq in 100 mL IVPB (0 mEq Intravenous Stopped 02/05/23 1238)  oxyCODONE (Oxy IR/ROXICODONE) immediate release tablet 10 mg (10 mg Oral Given 02/05/23 1247)    ED Course/ Medical Decision Making/ A&P                             Medical Decision Making Amount and/or Complexity of Data Reviewed Labs: ordered. Radiology: ordered.  Risk Prescription drug management.   This patient presents to the ED with chief complaint(s) of epigastric abdominal pain with associate nausea, vomiting, diarrhea with pertinent past medical history of alcohol abuse, tobacco abuse, PUD, GERD.  The complaint involves an extensive differential diagnosis and also carries with it a high risk of complications and morbidity.    The differential diagnosis includes acute pancreatitis, gastritis, acute cholecystitis, PUD, hepatitis, cholangitis, symptomatic cholelithiasis, esophagitis   The initial plan is to obtain abdominal pain workup  Initial Assessment:   Exam significant for a patient who appears distressed, is doubled over holding her abdomen and appears to be in significant pain.  Skin is warm and dry without jaundice.  Abdomen is soft and exquisitely tender to RUQ and epigastric region.  Heart rate is normal in the 80s with regular rhythm.  Lungs clear to auscultation bilaterally with normal effort.    Independent ECG/labs interpretation:  The following labs were independently interpreted:  CBC with leukocytosis and erythrocytosis, there is also neutrophilia.  Metabolic panel with hypokalemia, mildly elevated AST and glucose.   Lipase 519. Ethanol <10.    Independent visualization and interpretation of imaging: I independently visualized the following imaging with scope of interpretation limited to determining acute life threatening conditions related to  emergency care: CT abdomen pelvis, which revealed evidence of acute pancreatitis without necrosis.  I agree with radiologist interpretation.    Treatment and Reassessment: Patient given IV fluids, Zofran and Dilaudid to manage symptoms.  Patient continues to have pain, will repeat Dilaudid.  Will order CT scan due to patient being on day 4 of symptoms and having severe pain, assess for complications.   Patient has been able to tolerate sips of water and oral pain medications.  Patient reports pain medications have been helping.  Discussed with patient possibility of discharge, patient is concerned that she is not set up to manage her condition at home and is concerned that she will not be able to avoid alcohol.  Patient consumes alcohol daily.  She reports having gone through withdrawal when she was younger, and does not recall any serious issues with withdrawal such as seizures.    Consultations requested: I requested consultation with on-call hospitalist provider and discussed patient case with Dr. Renaee Munda who agreed  with hospital admission and agreed to come see patient.   Disposition:   Patient to be admitted to the hospital for acute alcoholic pancreatitis and to monitor for possible alcohol withdrawal.    Social Determinants of Health:   Patient's  alcohol abuse, tobacco abuse   increases the complexity of managing their presentation         Final Clinical Impression(s) / ED Diagnoses Final diagnoses:  Alcohol-induced acute pancreatitis without infection or necrosis    Rx / DC Orders ED Discharge Orders     None         Pat Kocher, PA-C 02/05/23 1455    Regan Lemming, MD 02/06/23 1759

## 2023-02-05 NOTE — ED Triage Notes (Signed)
Pt arrived from home BIB [facility] for abd pain mid epigastric pain since Tuesday, started vomiting on Thursday. Hx of gastric ulcers, takes Protonix. Pt admits to alcohol assumption  last drink was last night 10 pm. VSS, NAD noted. A&O x4.

## 2023-02-05 NOTE — Discharge Instructions (Addendum)
Additional Discharge Instructions   Please get your medications reviewed and adjusted by your Primary MD.  Please request your Primary MD to go over all Hospital Tests and Procedure/Radiological results at the follow up, please get all Hospital records sent to your Prim MD by signing hospital release before you go home.  If you had Pneumonia of Lung problems at the Hospital: Please get a 2 view Chest X ray done in approximately 4 weeks after hospital discharge or sooner if instructed by your Primary MD.  If you have Congestive Heart Failure: Please call your Cardiologist or Primary MD anytime you have any of the following symptoms:  1) 3 pound weight gain in 24 hours or 5 pounds in 1 week  2) shortness of breath, with or without a dry hacking cough  3) swelling in the hands, feet or stomach  4) if you have to sleep on extra pillows at night in order to breathe  Follow cardiac low salt diet and 1.5 lit/day fluid restriction.  If you have diabetes Accuchecks 4 times/day, Once in AM empty stomach and then before each meal. Log in all results and show them to your primary doctor at your next visit. If any glucose reading is under 80 or above 300 call your primary MD immediately.  If you have Seizure/Convulsions/Epilepsy: Please do not drive, operate heavy machinery, participate in activities at heights or participate in high speed sports until you have seen by Primary MD or a Neurologist and advised to do so again. Per [place] DMV statutes, patients with seizures are not allowed to drive until they have been seizure-free for six months.  Use caution when using heavy equipment or power tools. Avoid working on ladders or at heights. Take showers instead of baths. Ensure the water temperature is not too high on the home water heater. Do not go swimming alone. Do not lock yourself in a room alone (i.e. bathroom). When caring for infants or small children, sit down when holding, feeding, or  changing them to minimize risk of injury to the child in the event you have a seizure. Maintain good sleep hygiene. Avoid alcohol.   If you had Gastrointestinal Bleeding: Please ask your Primary MD to check a complete blood count within one week of discharge or at your next visit. Your endoscopic/colonoscopic biopsies that are pending at the time of discharge, will also need to followed by your Primary MD.  Get Medicines reviewed and adjusted. Please take all your medications with you for your next visit with your Primary MD  Please request your Primary MD to go over all hospital tests and procedure/radiological results at the follow up, please ask your Primary MD to get all Hospital records sent to his/her office.  If you experience worsening of your admission symptoms, develop shortness of breath, life threatening emergency, suicidal or homicidal thoughts you must seek medical attention immediately by calling 911 or calling your MD immediately  if symptoms less severe.  You must read complete instructions/literature along with all the possible adverse reactions/side effects for all the Medicines you take and that have been prescribed to you. Take any new Medicines after you have completely understood and accpet all the possible adverse reactions/side effects.   Do not drive or operate heavy machinery when taking Pain medications.   Do not take more than prescribed Pain, Sleep and Anxiety Medications  Special Instructions: If you have smoked or chewed Tobacco  in the last 2 yrs please stop smoking, stop any   regular Alcohol  and or any Recreational drug use.  Wear Seat belts while driving.  Please note You were cared for by a hospitalist during your hospital stay. If you have any questions about your discharge medications or the care you received while you were in the hospital after you are discharged, you can call the unit and asked to speak with the hospitalist on call if the hospitalist  that took care of you is not available. Once you are discharged, your primary care physician will handle any further medical issues. Please note that NO REFILLS for any discharge medications will be authorized once you are discharged, as it is imperative that you return to your primary care physician (or establish a relationship with a primary care physician if you do not have one) for your aftercare needs so that they can reassess your need for medications and monitor your lab values.  You can reach the hospitalist office at phone [phone] or fax [phone]   If you do not have a primary care physician, you can call [phone] for a physician referral.   As discussed with you on 02/12/2023, while you were in the hospital, there were incidental findings noted on the abdomen and chest x-ray such as some nonspecific findings of the colon and pulmonary nodules which will need close outpatient follow-up, and evaluation as necessary.  Please follow-up with your outpatient providers regarding this.

## 2023-02-06 DIAGNOSIS — F419 Anxiety disorder, unspecified: Secondary | ICD-10-CM | POA: Diagnosis not present

## 2023-02-06 DIAGNOSIS — K860 Alcohol-induced chronic pancreatitis: Secondary | ICD-10-CM | POA: Diagnosis present

## 2023-02-06 DIAGNOSIS — Z86010 Personal history of colonic polyps: Secondary | ICD-10-CM | POA: Diagnosis not present

## 2023-02-06 DIAGNOSIS — E876 Hypokalemia: Secondary | ICD-10-CM

## 2023-02-06 DIAGNOSIS — Z825 Family history of asthma and other chronic lower respiratory diseases: Secondary | ICD-10-CM | POA: Diagnosis not present

## 2023-02-06 DIAGNOSIS — D72829 Elevated white blood cell count, unspecified: Secondary | ICD-10-CM

## 2023-02-06 DIAGNOSIS — K852 Alcohol induced acute pancreatitis without necrosis or infection: Secondary | ICD-10-CM | POA: Diagnosis not present

## 2023-02-06 DIAGNOSIS — Z8249 Family history of ischemic heart disease and other diseases of the circulatory system: Secondary | ICD-10-CM | POA: Diagnosis not present

## 2023-02-06 DIAGNOSIS — Z821 Family history of blindness and visual loss: Secondary | ICD-10-CM | POA: Diagnosis not present

## 2023-02-06 DIAGNOSIS — R059 Cough, unspecified: Secondary | ICD-10-CM | POA: Diagnosis not present

## 2023-02-06 DIAGNOSIS — K219 Gastro-esophageal reflux disease without esophagitis: Secondary | ICD-10-CM | POA: Diagnosis not present

## 2023-02-06 DIAGNOSIS — Z888 Allergy status to other drugs, medicaments and biological substances status: Secondary | ICD-10-CM | POA: Diagnosis not present

## 2023-02-06 DIAGNOSIS — Z881 Allergy status to other antibiotic agents status: Secondary | ICD-10-CM | POA: Diagnosis not present

## 2023-02-06 DIAGNOSIS — D539 Nutritional anemia, unspecified: Secondary | ICD-10-CM | POA: Diagnosis present

## 2023-02-06 DIAGNOSIS — K859 Acute pancreatitis without necrosis or infection, unspecified: Secondary | ICD-10-CM | POA: Diagnosis present

## 2023-02-06 DIAGNOSIS — K5730 Diverticulosis of large intestine without perforation or abscess without bleeding: Secondary | ICD-10-CM | POA: Diagnosis present

## 2023-02-06 DIAGNOSIS — Z820 Family history of epilepsy and other diseases of the nervous system: Secondary | ICD-10-CM | POA: Diagnosis not present

## 2023-02-06 DIAGNOSIS — R918 Other nonspecific abnormal finding of lung field: Secondary | ICD-10-CM | POA: Diagnosis present

## 2023-02-06 DIAGNOSIS — F1010 Alcohol abuse, uncomplicated: Secondary | ICD-10-CM | POA: Diagnosis present

## 2023-02-06 DIAGNOSIS — D751 Secondary polycythemia: Secondary | ICD-10-CM | POA: Diagnosis present

## 2023-02-06 DIAGNOSIS — Z8261 Family history of arthritis: Secondary | ICD-10-CM | POA: Diagnosis not present

## 2023-02-06 DIAGNOSIS — F17210 Nicotine dependence, cigarettes, uncomplicated: Secondary | ICD-10-CM | POA: Diagnosis present

## 2023-02-06 DIAGNOSIS — R911 Solitary pulmonary nodule: Secondary | ICD-10-CM | POA: Diagnosis present

## 2023-02-06 DIAGNOSIS — Z885 Allergy status to narcotic agent status: Secondary | ICD-10-CM | POA: Diagnosis not present

## 2023-02-06 DIAGNOSIS — Z880 Allergy status to penicillin: Secondary | ICD-10-CM | POA: Diagnosis not present

## 2023-02-06 DIAGNOSIS — Z8711 Personal history of peptic ulcer disease: Secondary | ICD-10-CM | POA: Diagnosis not present

## 2023-02-06 LAB — COMPREHENSIVE METABOLIC PANEL
ALT: 19 U/L (ref 0–44)
AST: 27 U/L (ref 15–41)
Albumin: 2.9 g/dL — ABNORMAL LOW (ref 3.5–5.0)
Alkaline Phosphatase: 60 U/L (ref 38–126)
Anion gap: 8 (ref 5–15)
BUN: 5 mg/dL — ABNORMAL LOW (ref 6–20)
CO2: 20 mmol/L — ABNORMAL LOW (ref 22–32)
Calcium: 7.6 mg/dL — ABNORMAL LOW (ref 8.9–10.3)
Chloride: 106 mmol/L (ref 98–111)
Creatinine, Ser: 0.48 mg/dL (ref 0.44–1.00)
GFR, Estimated: >60 mL/min (ref >60)
Glucose, Bld: 62 mg/dL — ABNORMAL LOW (ref 70–99)
Potassium: 4.2 mmol/L (ref 3.5–5.1)
Sodium: 134 mmol/L — ABNORMAL LOW (ref 135–145)
Total Bilirubin: 1 mg/dL (ref 0.3–1.2)
Total Protein: 5.4 g/dL — ABNORMAL LOW (ref 6.5–8.1)

## 2023-02-06 LAB — CBC
HCT: 36.2 % (ref 36.0–46.0)
Hemoglobin: 12.1 g/dL (ref 12.0–15.0)
MCH: 36.1 pg — ABNORMAL HIGH (ref 26.0–34.0)
MCHC: 33.4 g/dL (ref 30.0–36.0)
MCV: 108.1 fL — ABNORMAL HIGH (ref 80.0–100.0)
Platelets: 226 10*3/uL (ref 150–400)
RBC: 3.35 MIL/uL — ABNORMAL LOW (ref 3.87–5.11)
RDW: 12.1 % (ref 11.5–15.5)
WBC: 10.9 10*3/uL — ABNORMAL HIGH (ref 4.0–10.5)
nRBC: 0 % (ref 0.0–0.2)

## 2023-02-06 LAB — TRIGLYCERIDES: Triglycerides: 59 mg/dL (ref <150)

## 2023-02-06 LAB — MAGNESIUM: Magnesium: 1.7 mg/dL (ref 1.7–2.4)

## 2023-02-06 LAB — HIV ANTIBODY (ROUTINE TESTING W REFLEX): HIV Screen 4th Generation wRfx: NONREACTIVE

## 2023-02-06 MED ORDER — SODIUM CHLORIDE 0.9 % IV BOLUS
2000.0000 mL | Freq: Once | INTRAVENOUS | Status: AC
  Administered 2023-02-06: 2000 mL via INTRAVENOUS

## 2023-02-06 MED ORDER — MAGNESIUM SULFATE 2 GM/50ML IV SOLN
2.0000 g | Freq: Once | INTRAVENOUS | Status: AC
  Administered 2023-02-06: 2 g via INTRAVENOUS
  Filled 2023-02-06: qty 50

## 2023-02-06 MED ORDER — DEXTROSE-NACL 5-0.9 % IV SOLN
INTRAVENOUS | Status: DC
  Administered 2023-02-11: 50 mL/h via INTRAVENOUS

## 2023-02-06 NOTE — Progress Notes (Signed)
Mobility Specialist - Progress Note   02/06/23 1402  Mobility  Activity Ambulated with assistance in hallway  Level of Assistance Modified independent, requires aide device or extra time  Assistive Device None  Distance Ambulated (ft) 400 ft  Activity Response Tolerated well  Mobility Referral Yes  $Mobility charge 1 Mobility   Pt received in bed and agreed to mobility. Some discomfort in LE nearing EOS. Pt returned to bed with all needs met.   Roderick Pee Mobility Specialist

## 2023-02-06 NOTE — Progress Notes (Signed)
PROGRESS NOTE    Deborah Peterson  [id number] DOB: 1966/03/14 DOA: 02/05/2023 PCP: Charyl Dancer, NP    Chief Complaint  Patient presents with   Abdominal Pain    Brief Narrative:  Patient 57 year old female history of GERD, daily alcohol use, anxiety, prior history of gastric ulcers presented with 4-day history of significant epigastric abdominal pain.  Patient on admission noted to have elevated lipase of 519.  CT abdomen and pelvis consistent with acute pancreatitis.  Patient admitted, placed on bowel rest, IV fluids, pain management.   Assessment & Plan:   Principal Problem:   Acute pancreatitis Active Problems:   Anxiety   Gastroesophageal reflux disease   Hypokalemia   Leukocytosis   Pulmonary nodule  #1 acute alcoholic pancreatitis -Patient presented with 4-day history of severe epigastric abdominal pain radiating to the back, noted to have elevated lipase level of 519. -CT abdomen and pelvis consistent with acute pancreatitis. -CT abdomen and pelvis negative for gallstones, gallbladder wall thickening, no biliary dilatation. -Triglyceride level of 59. -Patient still with significant epigastric abdominal pain. -Continue bowel rest. -Given 2 L normal saline bolus. -Change IV fluids to D5 normal saline at 150 cc an hour. -Continue current pain regimen, IV antiemetics, supportive care. -Alcohol cessation stressed to patient.  2.  Alcohol abuse -It is noted the patient is a heavy drinker, with a high risk of alcohol withdrawal. -Patient states drinks about 6 beers a day, and interested in cessation after this acute episode. -Continue Librium taper, thiamine, folic acid, multivitamin. -Check a thiamine level, vitamin B12 levels. -Continue as needed Ativan per CIWA protocol.  3.  GERD -IV PPI.  4.  Anxiety -Patient on Librium taper as well as as needed Ativan CIWA protocol.  5.  Hypokalemia -Likely secondary to GI losses. -Magnesium level at  1.7. -Potassium repleted. -Magnesium sulfate 2 g IV x 1. -Repeat labs in the AM.  6.  Leukocytosis -Likely reactive leukocytosis. -WBC trending down. -CT abdomen and pelvis negative for any infectious etiology. -Patient with no respiratory symptoms, no urinary symptoms. -Follow-up.  7.  Left pulmonary nodule -Noted on CT abdomen and pelvis scan. -Will need repeat CT chest in 6 to 12 months for follow-up on pulmonary nodule. -Outpatient follow-up with PCP.   DVT prophylaxis: Lovenox Code Status: Full Family Communication: Updated patient.  No family at bedside. Disposition: Home when clinically improved.  Status is: Observation The patient remains OBS appropriate and will d/c before 2 midnights.   Consultants:  None  Procedures:  CT abdomen and pelvis 02/05/2023  Antimicrobials:  None   Subjective: Patient sitting up in bed.  Still with significant epigastric/upper abdominal pain.  Had a bout of nausea or emesis in the ED on presentation however none since then.  Denies any chest pain or shortness of breath.  States after this episode she is going to quit alcohol use.  Objective: Vitals:   02/05/23 1733 02/05/23 2115 02/06/23 0142 02/06/23 0745  BP: (!) 168/75 (!) 146/70 137/66 120/75  Pulse: 79 75 84 82  Resp: 16 18 18    Temp: 97.6 F (36.4 C) 98.4 F (36.9 C) 97.8 F (36.6 C) 97.8 F (36.6 C)  TempSrc: Oral Oral  Oral  SpO2: 100% 100% 100%   Weight:      Height:        Intake/Output Summary (Last 24 hours) at 02/06/2023 1034 Last data filed at 02/06/2023 [id number] Gross per 24 hour  Intake 1527 ml  Output --  Net 1527  ml   Filed Weights   02/05/23 0555  Weight: 49.9 kg    Examination:  General exam: Appears calm and comfortable  Respiratory system: Clear to auscultation bilaterally.  No wheezes, no crackles, no rhonchi.  Fair air movement.  Speaking in full sentences.Marland Kitchen Respiratory effort normal. Cardiovascular system: S1 & S2 heard, RRR. No JVD,  murmurs, rubs, gallops or clicks. No pedal edema. Gastrointestinal system: Abdomen is nondistended, soft and tender to palpation in the epigastric region.  Positive bowel sounds.  No rebound.  No guarding.  Central nervous system: Alert and oriented. No focal neurological deficits. Extremities: Symmetric 5 x 5 power. Skin: No rashes, lesions or ulcers Psychiatry: Judgement and insight appear normal. Mood & affect appropriate.     Data Reviewed: I have personally reviewed following labs and imaging studies  CBC: Recent Labs  Lab 02/05/23 0658 02/05/23 1827 02/06/23 0341  WBC 12.9* 13.2* 10.9*  NEUTROABS 10.0*  --   --   HGB 15.2* 13.5 12.1  HCT 43.5 40.1 36.2  MCV 102.4* 105.0* 108.1*  PLT 334 271 [id number]    Basic Metabolic Panel: Recent Labs  Lab 02/05/23 0658 02/05/23 1827 02/06/23 0341  NA 139  --  134*  K 3.2*  --  4.2  CL 104  --  106  CO2 20*  --  20*  GLUCOSE 145*  --  62*  BUN 6  --  5*  CREATININE 0.58 0.54 0.48  CALCIUM 9.1  --  7.6*  MG 1.8  --  1.7    GFR: Estimated Creatinine Clearance: 58.5 mL/min (by C-G formula based on SCr of 0.48 mg/dL).  Liver Function Tests: Recent Labs  Lab 02/05/23 0658 02/06/23 0341  AST 43* 27  ALT 29 19  ALKPHOS 67 60  BILITOT 1.2 1.0  PROT 7.0 5.4*  ALBUMIN 3.6 2.9*    CBG: No results for input(s): "GLUCAP" in the last 168 hours.   No results found for this or any previous visit (from the past 240 hour(s)).       Radiology Studies: CT ABDOMEN PELVIS W CONTRAST  Result Date: 02/05/2023 CLINICAL DATA:  Pancreatitis, acute and severe. EXAM: CT ABDOMEN AND PELVIS WITH CONTRAST TECHNIQUE: Multidetector CT imaging of the abdomen and pelvis was performed using the standard protocol following bolus administration of intravenous contrast. RADIATION DOSE REDUCTION: This exam was performed according to the departmental dose-optimization program which includes automated exposure control, adjustment of the mA and/or kV  according to patient size and/or use of iterative reconstruction technique. CONTRAST:  122mL OMNIPAQUE IOHEXOL 300 MG/ML  SOLN COMPARISON:  None Available. FINDINGS: Lower chest: 7 mm left lower lobe lung nodule identified, image 5/6. No pleural fluid or consolidation. Hepatobiliary: No focal liver abnormality is seen. No gallstones, gallbladder wall thickening, or biliary dilatation. Pancreas: There is pancreatic edema with diffuse peripancreatic fat stranding compatible with the history of acute pancreatitis. Calcifications identified within the uncinate process of the pancreas. No signs of pancreatic necrosis. No focal fluid collections identified. Spleen: Normal in size without focal abnormality. Adrenals/Urinary Tract: Normal adrenal glands. No kidney mass or hydronephrosis. No nephrolithiasis. Urinary bladder is unremarkable. Stomach/Bowel: Small hiatal hernia. No pathologic dilatation of the large or small bowel loops. There is diffuse intramural fatty deposition up to the level of the distal descending colon. The colon appears diffusely decompressed with mild diffuse wall thickening. No pericolonic fat stranding or signs of pneumatosis. Extensive sigmoid diverticulosis without signs of acute diverticulitis. Vascular/Lymphatic: Extensive aortic atherosclerosis. Upper abdominal  vascularity remains patent. No signs of abdominopelvic adenopathy. Reproductive: Status post hysterectomy. No adnexal masses. Other: There is a small amount of fluid surrounding the head and uncinate process of pancreas. No discrete fluid collections identified within the abdomen or pelvis. No signs of pneumoperitoneum. Musculoskeletal: No acute or significant osseous findings. IMPRESSION: 1. Acute pancreatitis. No signs of pancreatic necrosis or focal fluid collections identified. 2. Diffuse intramural fatty deposition within the colon up to the level of the distal descending colon. This is a nonspecific finding but can be seen in the  setting of chronic inflammation. 3. Extensive sigmoid diverticulosis without signs of acute diverticulitis. 4. Left solid pulmonary nodule measuring 7 mm. Per Fleischner Society Guidelines, recommend a non-contrast Chest CT at 6-12 months. If patient is high risk for malignancy, consider an additional non-contrast Chest CT at 18-24 months. If patient is low risk for malignancy, non-contrast Chest CT at 18-24 months is optional. These guidelines do not apply to immunocompromised patients and patients with cancer. Follow up in patients with significant comorbidities as clinically warranted. For lung cancer screening, adhere to Lung-RADS guidelines. Reference: Radiology. 2017; 284(1):228-43. 5. Small hiatal hernia. 6.  Aortic Atherosclerosis (ICD10-I70.0). Electronically Signed   By: Kerby Moors M.D.   On: 02/05/2023 11:04        Scheduled Meds:  chlordiazePOXIDE  25 mg Oral QID   Followed by   chlordiazePOXIDE  25 mg Oral TID   Followed by   Derrill Memo ON 02/07/2023] chlordiazePOXIDE  25 mg Oral BH-qamhs   Followed by   Derrill Memo ON 02/08/2023] chlordiazePOXIDE  25 mg Oral Daily   cyanocobalamin  1,000 mcg Oral Daily   enoxaparin (LOVENOX) injection  40 mg Subcutaneous [id number]   folic acid  1 mg Oral Daily   multivitamin with minerals  1 tablet Oral Daily   nicotine  14 mg Transdermal Daily   pantoprazole (PROTONIX) IV  40 mg Intravenous Daily   thiamine  250 mg Oral Daily   Continuous Infusions:  dextrose 5 % and 0.9% NaCl     sodium chloride       LOS: 0 days    Time spent: 40 minutes    Irine Seal, MD [facility]   To contact the attending provider between 7A-7P or the covering provider during after hours 7P-7A, please log into the web site [email or web address] and access using universal  password for that web site. If you do not have the password, please call the hospital operator.  02/06/2023, 10:34 AM

## 2023-02-07 DIAGNOSIS — R911 Solitary pulmonary nodule: Secondary | ICD-10-CM

## 2023-02-07 DIAGNOSIS — F419 Anxiety disorder, unspecified: Secondary | ICD-10-CM | POA: Diagnosis not present

## 2023-02-07 DIAGNOSIS — K852 Alcohol induced acute pancreatitis without necrosis or infection: Secondary | ICD-10-CM | POA: Diagnosis not present

## 2023-02-07 DIAGNOSIS — F1010 Alcohol abuse, uncomplicated: Secondary | ICD-10-CM | POA: Diagnosis not present

## 2023-02-07 DIAGNOSIS — K219 Gastro-esophageal reflux disease without esophagitis: Secondary | ICD-10-CM | POA: Diagnosis not present

## 2023-02-07 LAB — COMPREHENSIVE METABOLIC PANEL
ALT: 15 U/L (ref 0–44)
AST: 22 U/L (ref 15–41)
Albumin: 2.5 g/dL — ABNORMAL LOW (ref 3.5–5.0)
Alkaline Phosphatase: 57 U/L (ref 38–126)
Anion gap: 4 — ABNORMAL LOW (ref 5–15)
BUN: <5 mg/dL — ABNORMAL LOW (ref 6–20)
CO2: 23 mmol/L (ref 22–32)
Calcium: 7.2 mg/dL — ABNORMAL LOW (ref 8.9–10.3)
Chloride: 109 mmol/L (ref 98–111)
Creatinine, Ser: 0.45 mg/dL (ref 0.44–1.00)
GFR, Estimated: >60 mL/min (ref >60)
Glucose, Bld: 96 mg/dL (ref 70–99)
Potassium: 2.9 mmol/L — ABNORMAL LOW (ref 3.5–5.1)
Sodium: 136 mmol/L (ref 135–145)
Total Bilirubin: 0.6 mg/dL (ref 0.3–1.2)
Total Protein: 5 g/dL — ABNORMAL LOW (ref 6.5–8.1)

## 2023-02-07 LAB — CBC WITH DIFFERENTIAL/PLATELET
Abs Immature Granulocytes: 0.03 10*3/uL (ref 0.00–0.07)
Basophils Absolute: 0 10*3/uL (ref 0.0–0.1)
Basophils Relative: 0 %
Eosinophils Absolute: 0.2 10*3/uL (ref 0.0–0.5)
Eosinophils Relative: 3 %
HCT: 32.6 % — ABNORMAL LOW (ref 36.0–46.0)
Hemoglobin: 10.8 g/dL — ABNORMAL LOW (ref 12.0–15.0)
Immature Granulocytes: 0 %
Lymphocytes Relative: 17 %
Lymphs Abs: 1.3 10*3/uL (ref 0.7–4.0)
MCH: 35.6 pg — ABNORMAL HIGH (ref 26.0–34.0)
MCHC: 33.1 g/dL (ref 30.0–36.0)
MCV: 107.6 fL — ABNORMAL HIGH (ref 80.0–100.0)
Monocytes Absolute: 0.8 10*3/uL (ref 0.1–1.0)
Monocytes Relative: 10 %
Neutro Abs: 5.3 10*3/uL (ref 1.7–7.7)
Neutrophils Relative %: 70 %
Platelets: 168 10*3/uL (ref 150–400)
RBC: 3.03 MIL/uL — ABNORMAL LOW (ref 3.87–5.11)
RDW: 12.2 % (ref 11.5–15.5)
WBC: 7.5 10*3/uL (ref 4.0–10.5)
nRBC: 0 % (ref 0.0–0.2)

## 2023-02-07 LAB — LIPASE, BLOOD: Lipase: 51 U/L (ref 11–51)

## 2023-02-07 LAB — MAGNESIUM: Magnesium: 2.3 mg/dL (ref 1.7–2.4)

## 2023-02-07 LAB — VITAMIN B12: Vitamin B-12: 593 pg/mL (ref 180–914)

## 2023-02-07 MED ORDER — POTASSIUM CHLORIDE CRYS ER 20 MEQ PO TBCR
40.0000 meq | EXTENDED_RELEASE_TABLET | ORAL | Status: AC
  Administered 2023-02-07 (×2): 40 meq via ORAL
  Filled 2023-02-07 (×2): qty 2

## 2023-02-07 MED ORDER — SIMETHICONE 80 MG PO CHEW
80.0000 mg | CHEWABLE_TABLET | Freq: Four times a day (QID) | ORAL | Status: DC
  Administered 2023-02-07 – 2023-02-13 (×24): 80 mg via ORAL
  Filled 2023-02-07 (×24): qty 1

## 2023-02-07 NOTE — Progress Notes (Signed)
PROGRESS NOTE    Deborah Peterson  [id number] DOB: Apr 29, 1966 DOA: 02/05/2023 PCP: Charyl Dancer, NP    Chief Complaint  Patient presents with   Abdominal Pain    Brief Narrative:  Patient 57 year old female history of GERD, daily alcohol use, anxiety, prior history of gastric ulcers presented with 4-day history of significant epigastric abdominal pain.  Patient on admission noted to have elevated lipase of 519.  CT abdomen and pelvis consistent with acute pancreatitis.  Patient admitted, placed on bowel rest, IV fluids, pain management.   Assessment & Plan:   Principal Problem:   Acute pancreatitis Active Problems:   Anxiety   Gastroesophageal reflux disease   Hypokalemia   Leukocytosis   Pulmonary nodule   Alcohol abuse  #1 acute alcoholic pancreatitis -Patient presented with 4-day history of severe epigastric abdominal pain radiating to the back, noted to have elevated lipase level of 519. -CT abdomen and pelvis consistent with acute pancreatitis. -CT abdomen and pelvis negative for gallstones, gallbladder wall thickening, no biliary dilatation. -Triglyceride level of 59. -Patient was placed on bowel rest, improving clinically.   -Epigastric abdominal pain improved, tolerated ice chips.   -Decrease IV fluids to 125 cc an hour.   -Placed on clear liquid diet today.   -IV antiemetics, supportive care.   -Alcohol cessation stressed to patient.   2.  Alcohol abuse -It is noted the patient is a heavy drinker, with a high risk of alcohol withdrawal. -Patient states drinks about 6 beers a day, and interested in cessation after this acute episode. -Continue Librium taper, thiamine, folic acid, multivitamin. -Thiamine level pending.  Vitamin B12 level at 593. -Continue as needed Ativan per CIWA protocol. -Alcohol cessation stressed to patient.  3.  GERD -IV PPI.    4.  Anxiety -Patient on Librium taper as well as as needed Ativan CIWA protocol.  5.   Hypokalemia -Likely secondary to GI losses. -Potassium at 2.9 this morning, magnesium at 2.3.   -K-Dur 40 mEq p.o. every 4 hours x 2 doses.   -Repeat labs in the AM.   6.  Leukocytosis -Likely reactive leukocytosis. -WBC trending down. -CT abdomen and pelvis negative for any infectious etiology. -Patient with no respiratory symptoms, no urinary symptoms. -Follow-.  7.  Left pulmonary nodule -Noted on CT abdomen and pelvis scan. -Will need repeat CT chest in 6 to 12 months for follow-up on pulmonary nodule. -Outpatient follow-up with PCP.   DVT prophylaxis: Lovenox Code Status: Full Family Communication: Updated patient.  No family at bedside. Disposition: Home when clinically improved hopefully in the next 1 to 2 days..  Status is: Observation The patient remains OBS appropriate and will d/c before 2 midnights.   Consultants:  None  Procedures:  CT abdomen and pelvis 02/05/2023  Antimicrobials:  None   Subjective: Laying in bed.  Denies any chest pain.  No shortness of breath.  Feels abdominal pain improving.  Feels overall starting to feel better.  Tolerated ice chips.    Objective: Vitals:   02/06/23 0745 02/06/23 1353 02/06/23 2010 02/07/23 0520  BP: 120/75 129/67 113/64 (!) 116/59  Pulse: 82 85 77 73  Resp:  16 20 20   Temp: 97.8 F (36.6 C) 98.2 F (36.8 C) 98.4 F (36.9 C) 98.3 F (36.8 C)  TempSrc: Oral Oral Oral Oral  SpO2:  98% 100% 99%  Weight:      Height:        Intake/Output Summary (Last 24 hours) at 02/07/2023 1050 Last  data filed at 02/07/2023 0602 Gross per 24 hour  Intake 2430.51 ml  Output --  Net 2430.51 ml    Filed Weights   02/05/23 0555  Weight: 49.9 kg    Examination:  General exam: NAD Respiratory system: CTAB.  No wheezes, no crackles, no rhonchi.  Fair air movement.  Speaking in full sentences.  Normal respiratory effort.  Cardiovascular system: Regular rate rhythm no murmurs rubs or gallops.  No JVD.  No lower extremity  edema.  Gastrointestinal system: Abdomen is soft, nondistended, decreased tenderness to palpation in the epigastrium.  Positive bowel sounds.  No rebound.  No guarding. Central nervous system: Alert and oriented. No focal neurological deficits. Extremities: Symmetric 5 x 5 power. Skin: No rashes, lesions or ulcers Psychiatry: Judgement and insight appear normal. Mood & affect appropriate.     Data Reviewed: I have personally reviewed following labs and imaging studies  CBC: Recent Labs  Lab 02/05/23 0658 02/05/23 1827 02/06/23 0341 02/07/23 0341  WBC 12.9* 13.2* 10.9* 7.5  NEUTROABS 10.0*  --   --  5.3  HGB 15.2* 13.5 12.1 10.8*  HCT 43.5 40.1 36.2 32.6*  MCV 102.4* 105.0* 108.1* 107.6*  PLT 334 271 226 168     Basic Metabolic Panel: Recent Labs  Lab 02/05/23 0658 02/05/23 1827 02/06/23 0341 02/07/23 0341  NA 139  --  134* 136  K 3.2*  --  4.2 2.9*  CL 104  --  106 109  CO2 20*  --  20* 23  GLUCOSE 145*  --  62* 96  BUN 6  --  5* <5*  CREATININE 0.58 0.54 0.48 0.45  CALCIUM 9.1  --  7.6* 7.2*  MG 1.8  --  1.7 2.3     GFR: Estimated Creatinine Clearance: 58.5 mL/min (by C-G formula based on SCr of 0.45 mg/dL).  Liver Function Tests: Recent Labs  Lab 02/05/23 0658 02/06/23 0341 02/07/23 0341  AST 43* 27 22  ALT 29 19 15   ALKPHOS 67 60 57  BILITOT 1.2 1.0 0.6  PROT 7.0 5.4* 5.0*  ALBUMIN 3.6 2.9* 2.5*     CBG: No results for input(s): "GLUCAP" in the last 168 hours.   No results found for this or any previous visit (from the past 240 hour(s)).       Radiology Studies: No results found.      Scheduled Meds:  chlordiazePOXIDE  25 mg Oral TID   Followed by   chlordiazePOXIDE  25 mg Oral BH-qamhs   Followed by   Derrill Memo ON 02/08/2023] chlordiazePOXIDE  25 mg Oral Daily   cyanocobalamin  1,000 mcg Oral Daily   enoxaparin (LOVENOX) injection  40 mg Subcutaneous [id number]   folic acid  1 mg Oral Daily   multivitamin with minerals  1 tablet Oral  Daily   nicotine  14 mg Transdermal Daily   pantoprazole (PROTONIX) IV  40 mg Intravenous Daily   potassium chloride  40 mEq Oral Q4H   simethicone  80 mg Oral QID   thiamine  250 mg Oral Daily   Continuous Infusions:  dextrose 5 % and 0.9% NaCl 125 mL/hr at 02/07/23 0940     LOS: 1 day    Time spent: 35 minutes    Irine Seal, MD [facility]   To contact the attending provider between 7A-7P or the covering provider during after hours 7P-7A, please log into the web site [email or web address] and access using universal [place] password for that web site. If you  do not have the password, please call the hospital operator.  02/07/2023, 10:50 AM

## 2023-02-07 NOTE — TOC Initial Note (Signed)
Transition of Care [facility]) - Initial/Assessment Note    Patient Details  Name: Deborah Peterson MRN: [id number] Date of Birth: [birth year]  Transition of Care [facility]) CM/SW Contact:    Joaquin Courts, RN Phone Number: 02/07/2023, 9:59 AM  Clinical Narrative:                  Patient from home, remains on RA and has a pcp.  Plan at this time is to return home at discharge.  SA resources added to AVS.  No further TOC needs identified at this time.   Expected Discharge Plan: Home/Self Care Barriers to Discharge: Continued Medical Work up   Patient Goals and CMS Choice            Expected Discharge Plan and Services   Discharge Planning Services: NA   Living arrangements for the past 2 months: Single Family Home                   DME Agency: NA       HH Arranged: NA          Prior Living Arrangements/Services Living arrangements for the past 2 months: Single Family Home   Patient language and need for interpreter reviewed:: Yes Do you feel safe going back to the place where you live?: Yes      Need for Family Participation in Patient Care: No (Comment)     Criminal Activity/Legal Involvement Pertinent to Current Situation/Hospitalization: No - Comment as needed  Activities of Daily Living Home Assistive Devices/Equipment: None ADL Screening (condition at time of admission) Patient's cognitive ability adequate to safely complete daily activities?: Yes Is the patient deaf or have difficulty hearing?: No Does the patient have difficulty seeing, even when wearing glasses/contacts?: No Does the patient have difficulty concentrating, remembering, or making decisions?: No Patient able to express need for assistance with ADLs?: No Does the patient have difficulty dressing or bathing?: No Independently performs ADLs?: Yes (appropriate for developmental age) Does the patient have difficulty walking or climbing stairs?: No Weakness of Legs: None Weakness of  Arms/Hands: None  Permission Sought/Granted                  Emotional Assessment Appearance:: Appears stated age     Orientation: : Oriented to Self, Oriented to Place, Oriented to  Time, Oriented to Situation Alcohol / Substance Use: Not Applicable Psych Involvement: No (comment)  Admission diagnosis:  Acute pancreatitis [K85.90] Alcohol-induced acute pancreatitis without infection or necrosis [K85.20] Patient Active Problem List   Diagnosis Date Noted   Alcohol abuse 02/06/2023   Acute pancreatitis 02/05/2023   Hypokalemia 02/05/2023   Leukocytosis 02/05/2023   Pulmonary nodule 02/05/2023   Concentration deficit 03/19/2022   Anxiety 03/03/2022   Brain fog 03/03/2022   Gastroesophageal reflux disease 03/03/2022   Left hip pain 03/03/2022   Chronic fatigue 03/03/2022   Tobacco use 03/03/2022   PCP:  Charyl Dancer, NP Pharmacy:   [facility], [place] [address] [place] [address] Phone: [phone] Fax: [phone]     Social Determinants of Health (SDOH) Social History: SDOH Screenings   Food Insecurity: No Food Insecurity (02/05/2023)  Housing: Low Risk  (02/05/2023)  Transportation Needs: No Transportation Needs (02/05/2023)  Utilities: Not At Risk (02/05/2023)  Depression (PHQ2-9): Medium Risk (03/19/2022)  Tobacco Use: High Risk (02/05/2023)   SDOH Interventions:     Readmission Risk Interventions  No data to display

## 2023-02-08 DIAGNOSIS — K219 Gastro-esophageal reflux disease without esophagitis: Secondary | ICD-10-CM | POA: Diagnosis not present

## 2023-02-08 DIAGNOSIS — F1010 Alcohol abuse, uncomplicated: Secondary | ICD-10-CM | POA: Diagnosis not present

## 2023-02-08 DIAGNOSIS — K852 Alcohol induced acute pancreatitis without necrosis or infection: Secondary | ICD-10-CM | POA: Diagnosis not present

## 2023-02-08 DIAGNOSIS — F419 Anxiety disorder, unspecified: Secondary | ICD-10-CM | POA: Diagnosis not present

## 2023-02-08 LAB — COMPREHENSIVE METABOLIC PANEL
ALT: 13 U/L (ref 0–44)
AST: 19 U/L (ref 15–41)
Albumin: 2.3 g/dL — ABNORMAL LOW (ref 3.5–5.0)
Alkaline Phosphatase: 62 U/L (ref 38–126)
Anion gap: 3 — ABNORMAL LOW (ref 5–15)
BUN: <5 mg/dL — ABNORMAL LOW (ref 6–20)
CO2: 21 mmol/L — ABNORMAL LOW (ref 22–32)
Calcium: 7.7 mg/dL — ABNORMAL LOW (ref 8.9–10.3)
Chloride: 113 mmol/L — ABNORMAL HIGH (ref 98–111)
Creatinine, Ser: 0.39 mg/dL — ABNORMAL LOW (ref 0.44–1.00)
GFR, Estimated: >60 mL/min (ref >60)
Glucose, Bld: 125 mg/dL — ABNORMAL HIGH (ref 70–99)
Potassium: 4.2 mmol/L (ref 3.5–5.1)
Sodium: 137 mmol/L (ref 135–145)
Total Bilirubin: 0.4 mg/dL (ref 0.3–1.2)
Total Protein: 4.8 g/dL — ABNORMAL LOW (ref 6.5–8.1)

## 2023-02-08 LAB — CBC WITH DIFFERENTIAL/PLATELET
Abs Immature Granulocytes: 0.02 10*3/uL (ref 0.00–0.07)
Basophils Absolute: 0 10*3/uL (ref 0.0–0.1)
Basophils Relative: 0 %
Eosinophils Absolute: 0.2 10*3/uL (ref 0.0–0.5)
Eosinophils Relative: 4 %
HCT: 31.7 % — ABNORMAL LOW (ref 36.0–46.0)
Hemoglobin: 10.4 g/dL — ABNORMAL LOW (ref 12.0–15.0)
Immature Granulocytes: 0 %
Lymphocytes Relative: 20 %
Lymphs Abs: 1.2 10*3/uL (ref 0.7–4.0)
MCH: 35.5 pg — ABNORMAL HIGH (ref 26.0–34.0)
MCHC: 32.8 g/dL (ref 30.0–36.0)
MCV: 108.2 fL — ABNORMAL HIGH (ref 80.0–100.0)
Monocytes Absolute: 0.6 10*3/uL (ref 0.1–1.0)
Monocytes Relative: 10 %
Neutro Abs: 3.7 10*3/uL (ref 1.7–7.7)
Neutrophils Relative %: 66 %
Platelets: 159 10*3/uL (ref 150–400)
RBC: 2.93 MIL/uL — ABNORMAL LOW (ref 3.87–5.11)
RDW: 12.4 % (ref 11.5–15.5)
WBC: 5.7 10*3/uL (ref 4.0–10.5)
nRBC: 0 % (ref 0.0–0.2)

## 2023-02-08 LAB — MAGNESIUM: Magnesium: 1.8 mg/dL (ref 1.7–2.4)

## 2023-02-08 NOTE — Progress Notes (Signed)
PROGRESS NOTE    Deborah Peterson  [id number] DOB: May 04, 1966 DOA: 02/05/2023 PCP: Charyl Dancer, NP    Chief Complaint  Patient presents with   Abdominal Pain    Brief Narrative:  Patient 57 year old female history of GERD, daily alcohol use, anxiety, prior history of gastric ulcers presented with 4-day history of significant epigastric abdominal pain.  Patient on admission noted to have elevated lipase of 519.  CT abdomen and pelvis consistent with acute pancreatitis.  Patient admitted, placed on bowel rest, IV fluids, pain management.   Assessment & Plan:   Principal Problem:   Acute pancreatitis Active Problems:   Anxiety   Gastroesophageal reflux disease   Hypokalemia   Leukocytosis   Pulmonary nodule   Alcohol abuse  #1 acute alcoholic pancreatitis -Patient presented with 4-day history of severe epigastric abdominal pain radiating to the back, noted to have elevated lipase level of 519. -CT abdomen and pelvis consistent with acute pancreatitis. -CT abdomen and pelvis negative for gallstones, gallbladder wall thickening, no biliary dilatation. -Triglyceride level of 59. -Patient was placed on bowel rest, and initially improved clinically.  -Patient started on clears which she tolerated.  -Patient advance to full liquid diet which she was unable to tolerate and caused increased epigastric abdominal pain today.  -Downgrade diet back to clear liquids.  -Decrease IV fluids to improving clinically.   -Epigastric abdominal pain improved, tolerated ice chips.   -Decrease IV fluids to 75 cc an hour.   -Reassess tomorrow and if improving clinically could rechallenge again with a full liquid diet.  -IV antiemetics, pain management, supportive care.   -Alcohol cessation stressed to patient.   2.  Alcohol abuse -It is noted the patient is a heavy drinker, with a high risk of alcohol withdrawal. -Patient states drinks about 6 beers a day, and interested in cessation  after this acute episode. -No significant signs of withdrawal noted. -Continue Librium taper, thiamine, folic acid, multivitamin. -Thiamine level pending.  Vitamin B12 level at 593. -Continue as needed Ativan per CIWA protocol. -Alcohol cessation stressed to patient.  3.  GERD -Continue IV PPI. -When tolerating oral intake will transition to oral PPI.  4.  Anxiety -Patient on Librium taper as well as as needed Ativan CIWA protocol.  5.  Hypokalemia -Likely secondary to GI losses. -Repleted.   -Potassium of 4.2.   -Repeat labs in the AM.   6.  Leukocytosis -Likely reactive leukocytosis. -WBC trended down and leukocytosis currently resolved.  -CT abdomen and pelvis negative for any infectious etiology. -Patient with no respiratory symptoms, no urinary symptoms. -No further workup needed.  7.  Left pulmonary nodule -Noted on CT abdomen and pelvis scan. -Will need repeat CT chest in 6 to 12 months for follow-up on pulmonary nodule. -Outpatient follow-up with PCP.   DVT prophylaxis: Lovenox Code Status: Full Family Communication: Updated patient.  No family at bedside. Disposition: Home when clinically improved and tolerating a soft diet, hopefully in the next 1 to 2 days..  Status is: Inpatient.    Consultants:  None  Procedures:  CT abdomen and pelvis 02/05/2023  Antimicrobials:  None   Subjective: Laying in bed.  States he tried some grits this morning with significant pain in the epigastric area and feels he may have inflamed it.  Stated she tolerated clears okay.  No chest pain.  No shortness of breath.  No nausea or emesis.    Objective: Vitals:   02/07/23 0520 02/07/23 1415 02/07/23 1900 02/08/23 0444  BP: (!) 116/59 118/67 108/60 (!) 118/57  Pulse: 73 77 80 75  Resp: 20 18 18 20   Temp: 98.3 F (36.8 C) (!) 97.5 F (36.4 C) 97.7 F (36.5 C) 98.2 F (36.8 C)  TempSrc: Oral  Oral Oral  SpO2: 99% 99% 98% 100%  Weight:    55.3 kg  Height:         Intake/Output Summary (Last 24 hours) at 02/08/2023 1116 Last data filed at 02/08/2023 0900 Gross per 24 hour  Intake 2242.17 ml  Output --  Net 2242.17 ml    Filed Weights   02/05/23 0555 02/08/23 0444  Weight: 49.9 kg 55.3 kg    Examination:  General exam: NAD Respiratory system: Lungs clear to auscultation bilaterally.  No wheezes, no crackles, no rhonchi.  Fair air movement.  Speaking in full sentences.  Cardiovascular system: RRR no murmurs rubs or gallops.  No JVD.  No lower extremity edema.  Gastrointestinal system: Abdomen is soft, nondistended, some tenderness to palpation epigastrium.  Positive bowel sounds.  No rebound.  No guarding.  Central nervous system: Alert and oriented.  Moving extremities spontaneously.  No focal neurological deficits.   Extremities: Symmetric 5 x 5 power. Skin: No rashes, lesions or ulcers Psychiatry: Judgement and insight appear normal. Mood & affect appropriate.     Data Reviewed: I have personally reviewed following labs and imaging studies  CBC: Recent Labs  Lab 02/05/23 0658 02/05/23 1827 02/06/23 0341 02/07/23 0341 02/08/23 0412  WBC 12.9* 13.2* 10.9* 7.5 5.7  NEUTROABS 10.0*  --   --  5.3 3.7  HGB 15.2* 13.5 12.1 10.8* 10.4*  HCT 43.5 40.1 36.2 32.6* 31.7*  MCV 102.4* 105.0* 108.1* 107.6* 108.2*  PLT 334 271 226 168 159     Basic Metabolic Panel: Recent Labs  Lab 02/05/23 0658 02/05/23 1827 02/06/23 0341 02/07/23 0341 02/08/23 0412  NA 139  --  134* 136 137  K 3.2*  --  4.2 2.9* 4.2  CL 104  --  106 109 113*  CO2 20*  --  20* 23 21*  GLUCOSE 145*  --  62* 96 125*  BUN 6  --  5* <5* <5*  CREATININE 0.58 0.54 0.48 0.45 0.39*  CALCIUM 9.1  --  7.6* 7.2* 7.7*  MG 1.8  --  1.7 2.3 1.8     GFR: Estimated Creatinine Clearance: 58.5 mL/min (A) (by C-G formula based on SCr of 0.39 mg/dL (L)).  Liver Function Tests: Recent Labs  Lab 02/05/23 0658 02/06/23 0341 02/07/23 0341 02/08/23 0412  AST 43* 27 22 19    ALT 29 19 15 13   ALKPHOS 67 60 57 62  BILITOT 1.2 1.0 0.6 0.4  PROT 7.0 5.4* 5.0* 4.8*  ALBUMIN 3.6 2.9* 2.5* 2.3*     CBG: No results for input(s): "GLUCAP" in the last 168 hours.   No results found for this or any previous visit (from the past 240 hour(s)).       Radiology Studies: No results found.      Scheduled Meds:  chlordiazePOXIDE  25 mg Oral Daily   cyanocobalamin  1,000 mcg Oral Daily   enoxaparin (LOVENOX) injection  40 mg Subcutaneous [id number]   folic acid  1 mg Oral Daily   multivitamin with minerals  1 tablet Oral Daily   nicotine  14 mg Transdermal Daily   pantoprazole (PROTONIX) IV  40 mg Intravenous Daily   simethicone  80 mg Oral QID   thiamine  250 mg Oral  Daily   Continuous Infusions:  dextrose 5 % and 0.9% NaCl 75 mL/hr at 02/08/23 0958     LOS: 2 days    Time spent: 35 minutes    Irine Seal, MD [facility]   To contact the attending provider between 7A-7P or the covering provider during after hours 7P-7A, please log into the web site [email or web address] and access using universal [place] password for that web site. If you do not have the password, please call the hospital operator.  02/08/2023, 11:16 AM

## 2023-02-09 DIAGNOSIS — K852 Alcohol induced acute pancreatitis without necrosis or infection: Secondary | ICD-10-CM | POA: Diagnosis not present

## 2023-02-09 LAB — CBC WITH DIFFERENTIAL/PLATELET
Abs Immature Granulocytes: 0.01 10*3/uL (ref 0.00–0.07)
Basophils Absolute: 0 10*3/uL (ref 0.0–0.1)
Basophils Relative: 0 %
Eosinophils Absolute: 0.2 10*3/uL (ref 0.0–0.5)
Eosinophils Relative: 4 %
HCT: 31.5 % — ABNORMAL LOW (ref 36.0–46.0)
Hemoglobin: 10.5 g/dL — ABNORMAL LOW (ref 12.0–15.0)
Immature Granulocytes: 0 %
Lymphocytes Relative: 21 %
Lymphs Abs: 1.2 10*3/uL (ref 0.7–4.0)
MCH: 36.1 pg — ABNORMAL HIGH (ref 26.0–34.0)
MCHC: 33.3 g/dL (ref 30.0–36.0)
MCV: 108.2 fL — ABNORMAL HIGH (ref 80.0–100.0)
Monocytes Absolute: 0.6 10*3/uL (ref 0.1–1.0)
Monocytes Relative: 11 %
Neutro Abs: 3.6 10*3/uL (ref 1.7–7.7)
Neutrophils Relative %: 64 %
Platelets: 191 10*3/uL (ref 150–400)
RBC: 2.91 MIL/uL — ABNORMAL LOW (ref 3.87–5.11)
RDW: 12.2 % (ref 11.5–15.5)
WBC: 5.6 10*3/uL (ref 4.0–10.5)
nRBC: 0 % (ref 0.0–0.2)

## 2023-02-09 LAB — BASIC METABOLIC PANEL
Anion gap: 4 — ABNORMAL LOW (ref 5–15)
BUN: <5 mg/dL — ABNORMAL LOW (ref 6–20)
CO2: 23 mmol/L (ref 22–32)
Calcium: 8.1 mg/dL — ABNORMAL LOW (ref 8.9–10.3)
Chloride: 111 mmol/L (ref 98–111)
Creatinine, Ser: 0.41 mg/dL — ABNORMAL LOW (ref 0.44–1.00)
GFR, Estimated: >60 mL/min (ref >60)
Glucose, Bld: 107 mg/dL — ABNORMAL HIGH (ref 70–99)
Potassium: 3.5 mmol/L (ref 3.5–5.1)
Sodium: 138 mmol/L (ref 135–145)

## 2023-02-09 LAB — GLUCOSE, CAPILLARY
Glucose-Capillary: 108 mg/dL — ABNORMAL HIGH (ref 70–99)
Glucose-Capillary: 101 mg/dL — ABNORMAL HIGH (ref 70–99)

## 2023-02-09 LAB — MAGNESIUM: Magnesium: 1.7 mg/dL (ref 1.7–2.4)

## 2023-02-09 MED ORDER — HYDROMORPHONE HCL 1 MG/ML IJ SOLN
1.0000 mg | INTRAMUSCULAR | Status: DC | PRN
  Administered 2023-02-09 – 2023-02-10 (×6): 1 mg via INTRAVENOUS
  Filled 2023-02-09 (×6): qty 1

## 2023-02-09 MED ORDER — MAGNESIUM SULFATE 2 GM/50ML IV SOLN
2.0000 g | Freq: Once | INTRAVENOUS | Status: AC
  Administered 2023-02-09: 2 g via INTRAVENOUS
  Filled 2023-02-09: qty 50

## 2023-02-09 MED ORDER — POTASSIUM CHLORIDE CRYS ER 20 MEQ PO TBCR
40.0000 meq | EXTENDED_RELEASE_TABLET | Freq: Once | ORAL | Status: AC
  Administered 2023-02-09: 40 meq via ORAL
  Filled 2023-02-09: qty 2

## 2023-02-09 NOTE — Progress Notes (Signed)
PROGRESS NOTE   Deborah Peterson  [id number]    DOB: [birth year]    DOA: 02/05/2023  PCP: Charyl Dancer, NP   I have briefly reviewed patients previous medical records in [facility].  Chief Complaint  Patient presents with   Abdominal Pain    Brief Narrative:  57 year old female with medical history significant for GERD, alcohol use disorder, tobacco use, anxiety, prior history of gastric ulcers presented with 4-day history of significant epigastric abdominal pain.  Noted to have elevated lipase of 519 and CT abdomen and pelvis consistent with acute pancreatitis.  Admitted for acute alcoholic pancreatitis, treating supportively with slow improvement.   Assessment & Plan:  Principal Problem:   Acute pancreatitis Active Problems:   Anxiety   Gastroesophageal reflux disease   Hypokalemia   Leukocytosis   Pulmonary nodule   Alcohol abuse   Acute alcoholic pancreatitis: CT A/P with contrast 3/30: Acute pancreatitis without pancreatic necrosis or fluid collections.  Lipase 519 which has since come down to 51.  LFTs otherwise unremarkable.  Triglycerides 59.  Treating supportively with bowel rest, IV fluids, pain management.  Attempts to answer from clears to full liquids yesterday was unsuccessful but she tried grits.  Since then mostly on clear liquids.  Willing to try full liquids today.  Continue to gradually advance diet as tolerated.  Multimodality pain control.  Patient sees Dr. Collene Mares, GI as outpatient.  Alcohol use disorder: Noted that patient is a heavy drinker.  Reportedly drinks about 6 beers a day.  No overt withdrawal.  CIWA scores are 0.  Completed Librium taper.  Continue thiamine, folate and multivitamins.  Abstinence and eventual cessation counseled.  Thiamine levels pending.  B12 level: 593.  GERD/history of ulcer disease: Reports ulcers were diagnosed by EGD by Dr. Collene Mares.  Continue IV PPI.  Anxiety disorder Completed Librium  taper.  Hypokalemia: Continue to replace as needed and follow.  Replace magnesium of 1.7 as well.  Leukocytosis, reactive: Resolved.  Macrocytic anemia: Suspected due to alcohol use disorder.  Stable.  B12 levels normal.  Incidental CT findings, for outpatient follow-up: 1. Diffuse intramural fatty deposition within the colon up to the level of the distal descending colon. This is a nonspecific finding but can be seen in the setting of chronic inflammation.  Recommend outpatient follow-up with Dr. Collene Mares and consider further evaluation as deemed necessary including colonoscopy if not recently performed. 2. Extensive sigmoid diverticulosis without signs of acute diverticulitis. 3. Left solid pulmonary nodule measuring 7 mm.  Per radiology, recommend a non-contrast Chest CT at 6-12 months. If patient is high risk for malignancy, consider an additional non-contrast Chest CT at 18-24 months. If patient is low risk for malignancy, non-contrast Chest CT at 18-24 months is optional.  Follow-up with PCP regarding this.  Tobacco cessation counseled.   Body mass index is 23.92 kg/m.   DVT prophylaxis: enoxaparin (LOVENOX) injection 40 mg Start: 02/05/23 2200 SCDs Start: 02/05/23 1507     Code Status: Full Code:  ACP Documents: None present. Family Communication: None at bedside Disposition:  Status is: Inpatient Remains inpatient appropriate because: Pending adequate pain control and toleration of advancing diet.     Consultants:     Procedures:     Antimicrobials:      Subjective:  Developed worsening pain after eating grits yesterday.  Diet downgraded to clear liquids.  Reports that her pain is about the same as yesterday 7/10.  Remains on mostly clear liquids per nursing.  Patient  willing to try full liquids i.e. strange soups etc.  Objective:   Vitals:   02/08/23 1950 02/09/23 0500 02/09/23 0533 02/09/23 1226  BP: (!) 123/59  102/61 122/62  Pulse: 82  71 76  Resp: 16  19 14    Temp: 97.8 F (36.6 C)  98.6 F (37 C) 98.2 F (36.8 C)  TempSrc: Oral  Oral   SpO2: 100%  100% 100%  Weight:  57.4 kg    Height:        General exam: Young female, moderately built and nourished lying comfortably propped up in bed without distress. Respiratory system: Clear to auscultation. Respiratory effort normal. Cardiovascular system: S1 & S2 heard, RRR. No JVD, murmurs, rubs, gallops or clicks. No pedal edema. Gastrointestinal system: Abdomen is nondistended, soft and nontender. No organomegaly or masses felt. Normal bowel sounds heard. Central nervous system: Alert and oriented. No focal neurological deficits. Extremities: Symmetric 5 x 5 power. Skin: No rashes, lesions or ulcers Psychiatry: Judgement and insight appear normal. Mood & affect appropriate.     Data Reviewed:   I have personally reviewed following labs and imaging studies   CBC: Recent Labs  Lab 02/07/23 0341 02/08/23 0412 02/09/23 0430  WBC 7.5 5.7 5.6  NEUTROABS 5.3 3.7 3.6  HGB 10.8* 10.4* 10.5*  HCT 32.6* 31.7* 31.5*  MCV 107.6* 108.2* 108.2*  PLT 168 159 [id number]    Basic Metabolic Panel: Recent Labs  Lab 02/05/23 0658 02/05/23 1827 02/06/23 0341 02/07/23 0341 02/08/23 0412 02/09/23 0430  NA 139  --  134* 136 137 138  K 3.2*  --  4.2 2.9* 4.2 3.5  CL 104  --  106 109 113* 111  CO2 20*  --  20* 23 21* 23  GLUCOSE 145*  --  62* 96 125* 107*  BUN 6  --  5* <5* <5* <5*  CREATININE 0.58 0.54 0.48 0.45 0.39* 0.41*  CALCIUM 9.1  --  7.6* 7.2* 7.7* 8.1*  MG 1.8  --  1.7 2.3 1.8 1.7    Liver Function Tests: Recent Labs  Lab 02/05/23 0658 02/06/23 0341 02/07/23 0341 02/08/23 0412  AST 43* 27 22 19   ALT 29 19 15 13   ALKPHOS 67 60 57 62  BILITOT 1.2 1.0 0.6 0.4  PROT 7.0 5.4* 5.0* 4.8*  ALBUMIN 3.6 2.9* 2.5* 2.3*    CBG: Recent Labs  Lab 02/09/23 0808 02/09/23 1228  GLUCAP 108* 101*    Microbiology Studies:  No results found for this or any previous visit (from the past  240 hour(s)).  Radiology Studies:  No results found.  Scheduled Meds:    cyanocobalamin  1,000 mcg Oral Daily   enoxaparin (LOVENOX) injection  40 mg Subcutaneous [id number]   folic acid  1 mg Oral Daily   multivitamin with minerals  1 tablet Oral Daily   nicotine  14 mg Transdermal Daily   pantoprazole (PROTONIX) IV  40 mg Intravenous Daily   simethicone  80 mg Oral QID   thiamine  250 mg Oral Daily    Continuous Infusions:    dextrose 5 % and 0.9% NaCl 75 mL/hr at 02/09/23 0556     LOS: 3 days     Vernell Leep, MD,  FACP, [facility], [facility], [facility], [place]     To contact the attending provider between 7A-7P or the covering provider during after hours 7P-7A, please log into the web site [email or web address] and access using universal [place]  password for that web site. If you do not have the password, please call the hospital operator.  02/09/2023, 3:07 PM

## 2023-02-10 ENCOUNTER — Inpatient Hospital Stay (HOSPITAL_COMMUNITY): Payer: 59

## 2023-02-10 DIAGNOSIS — K852 Alcohol induced acute pancreatitis without necrosis or infection: Secondary | ICD-10-CM | POA: Diagnosis not present

## 2023-02-10 LAB — HEPATIC FUNCTION PANEL
ALT: 15 U/L (ref 0–44)
AST: 22 U/L (ref 15–41)
Albumin: 2.2 g/dL — ABNORMAL LOW (ref 3.5–5.0)
Alkaline Phosphatase: 66 U/L (ref 38–126)
Bilirubin, Direct: 0.2 mg/dL (ref 0.0–0.2)
Indirect Bilirubin: 0.3 mg/dL (ref 0.3–0.9)
Total Bilirubin: 0.5 mg/dL (ref 0.3–1.2)
Total Protein: 5.1 g/dL — ABNORMAL LOW (ref 6.5–8.1)

## 2023-02-10 LAB — BASIC METABOLIC PANEL
Anion gap: 4 — ABNORMAL LOW (ref 5–15)
BUN: <5 mg/dL — ABNORMAL LOW (ref 6–20)
CO2: 24 mmol/L (ref 22–32)
Calcium: 8.2 mg/dL — ABNORMAL LOW (ref 8.9–10.3)
Chloride: 109 mmol/L (ref 98–111)
Creatinine, Ser: 0.5 mg/dL (ref 0.44–1.00)
GFR, Estimated: >60 mL/min (ref >60)
Glucose, Bld: 121 mg/dL — ABNORMAL HIGH (ref 70–99)
Potassium: 4.1 mmol/L (ref 3.5–5.1)
Sodium: 137 mmol/L (ref 135–145)

## 2023-02-10 LAB — MAGNESIUM: Magnesium: 1.8 mg/dL (ref 1.7–2.4)

## 2023-02-10 LAB — LIPASE, BLOOD: Lipase: 31 U/L (ref 11–51)

## 2023-02-10 LAB — VITAMIN B1: Vitamin B1 (Thiamine): 113.9 nmol/L (ref 66.5–200.0)

## 2023-02-10 MED ORDER — IOHEXOL 300 MG/ML  SOLN
100.0000 mL | Freq: Once | INTRAMUSCULAR | Status: AC | PRN
  Administered 2023-02-10: 100 mL via INTRAVENOUS

## 2023-02-10 NOTE — Consult Note (Signed)
Referring Provider: Dr. Algis Liming Primary Care Physician:  Charyl Dancer, NP Primary Gastroenterologist: Althia Forts  Reason for Consultation:  Pancreatitis  HPI: Deborah Peterson is a 57 y.o. female admitted for acute pancreatitis on 02/05/23 who has been medically managed but abdominal pain worsened today and called by hospitalist for consult. Patient has been dismissed by Dr. Collene Mares so called to see as unassigned pt. Patient drinks a 6 pack of beer daily for years. Denies previous episodes of pancreatitis. Abdominal pain worsened somewhat today located in her epigastric area. Denies N/V. History of gastric ulcers reportedly from NSAIDs in 2017 and reports stopping them at that time. Personal history of colon polyps in 2018 and overdue for surveillance colonoscopy.  Past Medical History:  Diagnosis Date   Alcoholism [facility])    Allergy 1985   Anxiety 1970   GERD (gastroesophageal reflux disease) 2017   Ulcer 2019   gastric ulcers    Past Surgical History:  Procedure Laterality Date   ABDOMINAL HYSTERECTOMY     CESAREAN [place]    Prior to Admission medications   Medication Sig Start Date End Date Taking? Authorizing Provider  acetaminophen (TYLENOL) 500 MG tablet Take 500 mg by mouth every 8 (eight) hours as needed for moderate pain.   Yes [provider]  pantoprazole (PROTONIX) 40 MG tablet Take 40 mg by mouth daily. 01/26/22  Yes [provider]  benzonatate (TESSALON) 100 MG capsule Take 1 capsule (100 mg total) by mouth 3 (three) times daily as needed. Patient not taking: Reported on 02/05/2023 04/09/22   Mar Daring, PA-C  cyanocobalamin 1000 MCG tablet Take 1,000 mcg by mouth daily. Patient not taking: Reported on 02/05/2023    [provider]  thiamine 250 MG tablet Take 250 mg by mouth daily. Patient not taking: Reported on 02/05/2023    [provider]  Vitamin D, Ergocalciferol, (DRISDOL) 1.25 MG (50000  UNIT) CAPS capsule Take 1 capsule (50,000 Units total) by mouth every 7 (seven) days. Patient not taking: Reported on 02/05/2023 03/04/22   Charyl Dancer, NP    Scheduled Meds:  cyanocobalamin  1,000 mcg Oral Daily   enoxaparin (LOVENOX) injection  40 mg Subcutaneous [id number]   folic acid  1 mg Oral Daily   multivitamin with minerals  1 tablet Oral Daily   nicotine  14 mg Transdermal Daily   pantoprazole (PROTONIX) IV  40 mg Intravenous Daily   simethicone  80 mg Oral QID   thiamine  250 mg Oral Daily   Continuous Infusions:  dextrose 5 % and 0.9% NaCl 50 mL/hr (02/10/23 1304)   PRN Meds:.acetaminophen **OR** acetaminophen, albuterol, benzonatate, hydrALAZINE, HYDROmorphone (DILAUDID) injection, [DISCONTINUED] ondansetron **OR** ondansetron (ZOFRAN) IV, oxyCODONE, traZODone  Allergies as of 02/05/2023 - Review Complete 02/05/2023  Allergen Reaction Noted   Erythromycin Anaphylaxis 12/16/2021   Motrin [ibuprofen]  09/22/2015   Penicillins  09/22/2015   Tramadol  09/22/2015   Ciprofloxacin Itching 12/16/2021    Family History  Problem Relation Age of Onset   Cancer Mother        breast   Alzheimer's disease Mother    Heart disease Father    ADD / ADHD Son    Arthritis Maternal Grandmother    Vision loss Maternal Grandmother    Cancer Paternal Grandmother        ovarian   COPD Paternal Grandmother    Vision loss Paternal Grandmother    Heart disease Paternal Grandfather  Social History   Socioeconomic History   Marital status: Divorced    Spouse name: Not on file   Number of children: Not on file   Years of education: Not on file   Highest education level: Not on file  Occupational History   Not on file  Tobacco Use   Smoking status: Every Day    Packs/day: 0.25    Years: 40.00    Additional pack years: 0.00    Total pack years: 10.00    Types: Cigarettes   Smokeless tobacco: Never   Tobacco comments:    Down to 5 cigarette a day   Vaping Use   Vaping  Use: Never used  Substance and Sexual Activity   Alcohol use: Yes    Alcohol/week: 6.0 standard drinks of alcohol    Types: 6 Cans of beer per week   Drug use: Never   Sexual activity: Not Currently    Birth control/protection: Abstinence, Surgical  Other Topics Concern   Not on file  Social History Narrative   Right handed    Lives in a one story home    Drinks Caffeine    Social Determinants of Health   Financial Resource Strain: Not on file  Food Insecurity: No Food Insecurity (02/05/2023)   Hunger Vital Sign    Worried About Running Out of Food in the Last Year: Never true    Ran Out of Food in the Last Year: Never true  Transportation Needs: No Transportation Needs (02/05/2023)   PRAPARE - Hydrologist (Medical): No    Lack of Transportation (Non-Medical): No  Physical Activity: Not on file  Stress: Not on file  Social Connections: Not on file  Intimate Partner Violence: Not At Risk (02/05/2023)   Humiliation, Afraid, Rape, and Kick questionnaire    Fear of Current or Ex-Partner: No    Emotionally Abused: No    Physically Abused: No    Sexually Abused: No    Review of Systems: All negative except as stated above in HPI.  Physical Exam: Vital signs: Vitals:   02/10/23 0528 02/10/23 1241  BP: (!) 104/53 (!) 97/53  Pulse: 79 76  Resp: 15 14  Temp: 99 F (37.2 C) 98.1 F (36.7 C)  SpO2: 97% 100%   Last BM Date : 02/06/23 General: Lethargic, thin, pleasant and cooperative in NAD Head: normocephalic, atraumatic Eyes: anicteric sclera ENT: oropharynx clear Neck: supple, nontender Lungs:  Clear throughout to auscultation.   No wheezes, crackles, or rhonchi. No acute distress. Heart:  Regular rate and rhythm; no murmurs, clicks, rubs,  or gallops. Abdomen: epigastric tenderness with guarding, otherwise nontender, soft, nondistended, +BS  Rectal:  Deferred Ext: no edema  GI:  Lab Results: Recent Labs    02/08/23 0412  02/09/23 0430  WBC 5.7 5.6  HGB 10.4* 10.5*  HCT 31.7* 31.5*  PLT 159 191   BMET Recent Labs    02/08/23 0412 02/09/23 0430 02/10/23 0403  NA 137 138 137  K 4.2 3.5 4.1  CL 113* 111 109  CO2 21* 23 24  GLUCOSE 125* 107* 121*  BUN <5* <5* <5*  CREATININE 0.39* 0.41* 0.50  CALCIUM 7.7* 8.1* 8.2*   LFT Recent Labs    02/10/23 0403  PROT 5.1*  ALBUMIN 2.2*  AST 22  ALT 15  ALKPHOS 66  BILITOT 0.5  BILIDIR 0.2  IBILI 0.3   PT/INR No results for input(s): "LABPROT", "INR" in the last 72 hours.  Studies/Results: No results found.  Impression/Plan: Acute alcoholic pancreatitis who had worsening of her epigastric pain today. Repeat CT planned to look for pseudocysts or necrosis of pancreatitis. Lipase normal at 31 down from 519 on admit on 02/05/23. LFTs normal. Patient claims that she is done with drinking. Clear liquid diet. Supportive care. Await updated CT scan. Outpt surveillance colonoscopy. Will f/u.    LOS: 4 days   Lear Ng  02/10/2023, 3:58 PM  Questions please call [phone]

## 2023-02-10 NOTE — Progress Notes (Signed)
PROGRESS NOTE   Deborah Peterson  [id number]    DOB: 01/20/1966    DOA: 02/05/2023  PCP: Charyl Dancer, NP   I have briefly reviewed patients previous medical records in [facility].  Chief Complaint  Patient presents with   Abdominal Pain    Brief Narrative:  57 year old female with medical history significant for GERD, alcohol use disorder, tobacco use, anxiety, prior history of gastric ulcers presented with 4-day history of significant epigastric abdominal pain.  Noted to have elevated lipase of 519 and CT abdomen and pelvis consistent with acute pancreatitis.  Admitted for acute alcoholic pancreatitis.  Despite supportive treatment, initially appeared to make slow improvement but today reports that her epigastric abdominal pain may be slightly worse compared to the day prior.  GI/Dr. Collene Mares consulted for assistance.   Assessment & Plan:  Principal Problem:   Acute pancreatitis Active Problems:   Anxiety   Gastroesophageal reflux disease   Hypokalemia   Leukocytosis   Pulmonary nodule   Alcohol abuse   Acute alcoholic pancreatitis: CT A/P with contrast 3/30: Acute pancreatitis without pancreatic necrosis or fluid collections.  Lipase 519 which has since come down to 51.  LFTs otherwise unremarkable.  Triglycerides 59.  Treating supportively with bowel rest, IV fluids, pain management.  Despite supportive treatment, initially appeared to make slow improvement but today reports that her epigastric abdominal pain may be slightly worse compared to the day prior.  GI/Dr. Collene Mares consulted for assistance.  On full liquids but not taking in much.  Having BMs.  Mobilizing.  Still requiring p.o./IV opioids for pain control.  Will add LFTs and lipase to this morning's labs.  Will await GI input regarding further management, repeat imaging etc.  Cough: Started overnight 4/3.  Currently without clinical concern for pneumonia or volume overload.  Incentive spirometry.  Supportive  treatment with cough medicines.  If worsens, consider chest x-ray.  Will cut back on IV fluids.  Alcohol use disorder: Noted that patient is a heavy drinker.  Reportedly drinks about 6 beers a day.  No overt withdrawal.  CIWA scores are 0.  Completed Librium taper.  Continue thiamine, folate and multivitamins.  Abstinence and eventual cessation counseled.  Thiamine levels pending.  B12 level: 593.  GERD/history of ulcer disease: Reports ulcers were diagnosed by EGD by Dr. Collene Mares.  Continue IV PPI.  Differential diagnosis for her abdominal pain may also be PUD.  Await GI input.  Anxiety disorder Completed Librium taper.  Hypokalemia: Replaced.  Follow and continue to replace as needed.  Magnesium up to 1.8.  Leukocytosis, reactive: Resolved.  Macrocytic anemia: Suspected due to alcohol use disorder.  Stable.  B12 levels normal.  Incidental CT findings, for outpatient follow-up: 1. Diffuse intramural fatty deposition within the colon up to the level of the distal descending colon. This is a nonspecific finding but can be seen in the setting of chronic inflammation.  Recommend outpatient follow-up with Dr. Collene Mares and consider further evaluation as deemed necessary including colonoscopy if not recently performed. 2. Extensive sigmoid diverticulosis without signs of acute diverticulitis. 3. Left solid pulmonary nodule measuring 7 mm.  Per radiology, recommend a non-contrast Chest CT at 6-12 months. If patient is high risk for malignancy, consider an additional non-contrast Chest CT at 18-24 months. If patient is low risk for malignancy, non-contrast Chest CT at 18-24 months is optional.  Follow-up with PCP regarding this.  Tobacco cessation counseled.   Body mass index is 23.62 kg/m.   DVT  prophylaxis: enoxaparin (LOVENOX) injection 40 mg Start: 02/05/23 2200 SCDs Start: 02/05/23 1507     Code Status: Full Code:  ACP Documents: None present. Family Communication: None at  bedside Disposition:  Inpatient appropriate due to ongoing abdominal pain, requires further evaluation and management, GI consulted, remains on IV fluids, IV/p.o. opioids and yet to advance diet.     Consultants:   GI/Dr. Collene Mares  Procedures:     Antimicrobials:      Subjective:  Patient reports that she tried strange soup last night and this morning.  Eating very little.  Feels that her epigastric abdominal pain today is worse than yesterday, was down to 6 but back up to 7/10.  No nausea or vomiting.  Having BM.  Ambulating per RN.  Reports new onset cough, nonproductive that started last night without dyspnea or fevers.  Objective:   Vitals:   02/09/23 1226 02/09/23 2021 02/10/23 0500 02/10/23 0528  BP: 122/62 (!) 115/56  (!) 104/53  Pulse: 76 79  79  Resp: 14 18  15   Temp: 98.2 F (36.8 C) 98.6 F (37 C)  99 F (37.2 C)  TempSrc:  Oral  Oral  SpO2: 100% 100%  97%  Weight:   56.7 kg   Height:        General exam: Young female, moderately built and nourished lying sitting up in bed, getting ready to drink or soup.  Appears uncomfortable but not in overt painful distress Respiratory system: Clear to auscultation without wheezing, rhonchi or crackles.  No increased work of breathing. Cardiovascular system: S1 & S2 heard, RRR. No JVD, murmurs, rubs, gallops or clicks. No pedal edema. Gastrointestinal system: Abdomen is nondistended, soft and nontender. No organomegaly or masses felt. Normal bowel sounds heard.  Unchanged exam from yesterday. Central nervous system: Alert and oriented. No focal neurological deficits. Extremities: Symmetric 5 x 5 power. Skin: No rashes, lesions or ulcers Psychiatry: Judgement and insight appear normal. Mood & affect appropriate.     Data Reviewed:   I have personally reviewed following labs and imaging studies   CBC: Recent Labs  Lab 02/07/23 0341 02/08/23 0412 02/09/23 0430  WBC 7.5 5.7 5.6  NEUTROABS 5.3 3.7 3.6  HGB 10.8* 10.4*  10.5*  HCT 32.6* 31.7* 31.5*  MCV 107.6* 108.2* 108.2*  PLT 168 159 [id number]    Basic Metabolic Panel: Recent Labs  Lab 02/06/23 0341 02/07/23 0341 02/08/23 0412 02/09/23 0430 02/10/23 0403  NA 134* 136 137 138 137  K 4.2 2.9* 4.2 3.5 4.1  CL 106 109 113* 111 109  CO2 20* 23 21* 23 24  GLUCOSE 62* 96 125* 107* 121*  BUN 5* <5* <5* <5* <5*  CREATININE 0.48 0.45 0.39* 0.41* 0.50  CALCIUM 7.6* 7.2* 7.7* 8.1* 8.2*  MG 1.7 2.3 1.8 1.7 1.8    Liver Function Tests: Recent Labs  Lab 02/05/23 0658 02/06/23 0341 02/07/23 0341 02/08/23 0412  AST 43* 27 22 19   ALT 29 19 15 13   ALKPHOS 67 60 57 62  BILITOT 1.2 1.0 0.6 0.4  PROT 7.0 5.4* 5.0* 4.8*  ALBUMIN 3.6 2.9* 2.5* 2.3*    CBG: Recent Labs  Lab 02/09/23 0808 02/09/23 1228  GLUCAP 108* 101*    Microbiology Studies:  No results found for this or any previous visit (from the past 240 hour(s)).  Radiology Studies:  No results found.  Scheduled Meds:    cyanocobalamin  1,000 mcg Oral Daily   enoxaparin (LOVENOX) injection  40 mg Subcutaneous  [id number]   folic acid  1 mg Oral Daily   multivitamin with minerals  1 tablet Oral Daily   nicotine  14 mg Transdermal Daily   pantoprazole (PROTONIX) IV  40 mg Intravenous Daily   simethicone  80 mg Oral QID   thiamine  250 mg Oral Daily    Continuous Infusions:    dextrose 5 % and 0.9% NaCl 75 mL/hr at 02/10/23 0543     LOS: 4 days     Vernell Leep, MD,  FACP, [facility], [facility], [facility], [place]     To contact the attending provider between 7A-7P or the covering provider during after hours 7P-7A, please log into the web site [email or web address] and access using universal [place] password for that web site. If you do not have the password, please call the hospital operator.  02/10/2023, 12:01 PM

## 2023-02-10 NOTE — Plan of Care (Signed)

## 2023-02-11 ENCOUNTER — Inpatient Hospital Stay (HOSPITAL_COMMUNITY): Payer: 59

## 2023-02-11 DIAGNOSIS — K852 Alcohol induced acute pancreatitis without necrosis or infection: Secondary | ICD-10-CM | POA: Diagnosis not present

## 2023-02-11 LAB — COMPREHENSIVE METABOLIC PANEL
ALT: 14 U/L (ref 0–44)
AST: 18 U/L (ref 15–41)
Albumin: 2.2 g/dL — ABNORMAL LOW (ref 3.5–5.0)
Alkaline Phosphatase: 68 U/L (ref 38–126)
Anion gap: 5 (ref 5–15)
BUN: <5 mg/dL — ABNORMAL LOW (ref 6–20)
CO2: 26 mmol/L (ref 22–32)
Calcium: 8.5 mg/dL — ABNORMAL LOW (ref 8.9–10.3)
Chloride: 106 mmol/L (ref 98–111)
Creatinine, Ser: 0.49 mg/dL (ref 0.44–1.00)
GFR, Estimated: >60 mL/min (ref >60)
Glucose, Bld: 114 mg/dL — ABNORMAL HIGH (ref 70–99)
Potassium: 3.5 mmol/L (ref 3.5–5.1)
Sodium: 137 mmol/L (ref 135–145)
Total Bilirubin: 0.5 mg/dL (ref 0.3–1.2)
Total Protein: 5.2 g/dL — ABNORMAL LOW (ref 6.5–8.1)

## 2023-02-11 LAB — CBC
HCT: 30.3 % — ABNORMAL LOW (ref 36.0–46.0)
Hemoglobin: 10.3 g/dL — ABNORMAL LOW (ref 12.0–15.0)
MCH: 36.1 pg — ABNORMAL HIGH (ref 26.0–34.0)
MCHC: 34 g/dL (ref 30.0–36.0)
MCV: 106.3 fL — ABNORMAL HIGH (ref 80.0–100.0)
Platelets: 206 10*3/uL (ref 150–400)
RBC: 2.85 MIL/uL — ABNORMAL LOW (ref 3.87–5.11)
RDW: 12 % (ref 11.5–15.5)
WBC: 5.4 10*3/uL (ref 4.0–10.5)
nRBC: 0 % (ref 0.0–0.2)

## 2023-02-11 MED ORDER — PANTOPRAZOLE SODIUM 40 MG PO TBEC
40.0000 mg | DELAYED_RELEASE_TABLET | Freq: Every day | ORAL | Status: DC
  Administered 2023-02-12 – 2023-02-13 (×2): 40 mg via ORAL
  Filled 2023-02-11 (×2): qty 1

## 2023-02-11 NOTE — Progress Notes (Signed)
PROGRESS NOTE   Deborah Peterson  ZOX:[id number]    DOB: 01-Sep-1966    DOA: 02/05/2023  PCP: Gerre ScullMcElwee, Lauren A, NP   I have briefly reviewed patients [facility]  Chief Complaint  Patient presents with   Abdominal Pain    Brief Narrative:  57 year old female with medical history significant for GERD, alcohol use disorder, tobacco use, anxiety, prior history of gastric ulcers presented with 4-day history of significant epigastric abdominal pain.  Noted to have elevated lipase of 519 and CT abdomen and pelvis consistent with acute pancreatitis.  Admitted for acute alcoholic pancreatitis.  Despite supportive treatment, initially appeared to make slow improvement but today reports that her epigastric abdominal pain may be slightly worse compared to the day prior.  [facility] consulted for assistance.  Repeated CT abdomen-no pancreatic necrosis or pseudocyst.  Continues to slowly improve again.   Assessment & Plan:  Principal Problem:   Acute pancreatitis Active Problems:   Anxiety   Gastroesophageal reflux disease   Hypokalemia   Leukocytosis   Pulmonary nodule   Alcohol abuse   Acute alcoholic pancreatitis: CT A/P with contrast 3/30: Acute pancreatitis without pancreatic necrosis or fluid collections.  Lipase 519 which has since come down to 51.  LFTs otherwise unremarkable.  Triglycerides 59.  Treating supportively with bowel rest, IV fluids, pain management.  Despite supportive treatment, initially appeared to make slow improvement but then reported worsening abdominal pain.  Repeat LFTs and lipase unremarkable.  Repeat CT abdomen without pancreatic necrosis or pseudocyst.  [facility] consulted and input appreciated.  Better today, encouraged full liquid diet.  Having BMs.  Mobilizing.  Cough: Started overnight 4/3.  Currently without clinical concern for pneumonia or volume overload.  Supportive treatment with cough medicines.  CT abdomen 4/4  suggested pneumonia findings in the basal aspect of lungs.  Cough however better today, no dyspnea, fever or leukocytosis.  Obtain chest x-ray and negative for pneumonia.  Holding off antibiotics.  Continue incentive spirometry  Alcohol use disorder: Noted that patient is a heavy drinker.  Reportedly drinks about 6 beers a day.  No overt withdrawal.  CIWA scores are 0.  Completed Librium taper.  Continue thiamine, folate and multivitamins.  Abstinence and eventual cessation counseled.  Thiamine levels pending.  B12 level: 593.  GERD/history of ulcer disease: Reports ulcers were diagnosed by EGD by Dr. Loreta AveMann.  Continue IV PPI.  Differential diagnosis for her abdominal pain may also be PUD.    Anxiety disorder Completed Librium taper.  Hypokalemia: Replaced.  Follow and continue to replace as needed.  Magnesium up to 1.8.  Leukocytosis, reactive: Resolved.  Macrocytic anemia: Suspected due to alcohol use disorder.  Stable.  B12 levels normal.  Incidental CT findings, for outpatient follow-up: 1. Diffuse intramural fatty deposition within the colon up to the level of the distal descending colon. This is a nonspecific finding but can be seen in the setting of chronic inflammation.  Recommend outpatient follow-up with Dr. Loreta AveMann and consider further evaluation as deemed necessary including colonoscopy if not recently performed. 2. Extensive sigmoid diverticulosis without signs of acute diverticulitis. 3. Left lower lobe solid pulmonary nodule measuring 7 mm.  Per radiology, recommend a non-contrast Chest CT at 6-12 months. If patient is high risk for malignancy, consider an additional non-contrast Chest CT at 18-24 months. If patient is low risk for malignancy, non-contrast Chest CT at 18-24 months is optional.  Follow-up with PCP regarding this.  Tobacco cessation counseled.  Incidental chest x-ray findings, for outpatient follow-up: Vague 1 cm nodule over the right upper lobe, noted on chest  x-ray 4/5.  As per recommendations, nonemergent CT chest without contrast, to be pursued as outpatient via PCPs office.   Body mass index is 23.62 kg/m.   DVT prophylaxis: enoxaparin (LOVENOX) injection 40 mg Start: 02/05/23 2200 SCDs Start: 02/05/23 1507     Code Status: Full Code:  ACP Documents: None present. Family Communication: None at bedside Disposition:  DC pending diet advancement and pain control, hopefully in the next 48 hours.     Consultants:   GI/Dr. Loreta AveMann  Procedures:     Antimicrobials:      Subjective:  Per patient, cough is less.  No shortness of breath or fever.  No chest pain.  Abdominal pain is better.  Having BMs.  Objective:   Vitals:   02/10/23 1241 02/10/23 1955 02/11/23 0551 02/11/23 1328  BP: (!) 97/53 (!) 93/49 115/66 (!) 94/53  Pulse: 76 71 82 70  Resp: 14 15 17 16   Temp: 98.1 F (36.7 C) 98.1 F (36.7 C) 99.1 F (37.3 C) 98.4 F (36.9 C)  TempSrc: Oral     SpO2: 100% 99% 94% 96%  Weight:      Height:        General exam: Young female, moderately built and nourished lying in bed without distress. Respiratory system: Slightly diminished breath sounds in the bases but otherwise clear to auscultation without wheezing, rhonchi or crackles. Cardiovascular system: S1 & S2 heard, RRR. No JVD, murmurs, rubs, gallops or clicks. No pedal edema. Gastrointestinal system: Abdomen is not distended, soft and nontender.  No organomegaly or masses felt. Normal bowel sounds heard.  Unchanged exam from yesterday. Central nervous system: Alert and oriented. No focal neurological deficits. Extremities: Symmetric 5 x 5 power. Skin: No rashes, lesions or ulcers Psychiatry: Judgement and insight appear normal. Mood & affect appropriate.     Data Reviewed:   I have personally reviewed following labs and imaging studies   CBC: Recent Labs  Lab 02/07/23 0341 02/08/23 0412 02/09/23 0430 02/11/23 0343  WBC 7.5 5.7 5.6 5.4  NEUTROABS 5.3 3.7 3.6   --   HGB 10.8* 10.4* 10.5* 10.3*  HCT 32.6* 31.7* 31.5* 30.3*  MCV 107.6* 108.2* 108.2* 106.3*  PLT 168 159 191 206    Basic Metabolic Panel: Recent Labs  Lab 02/06/23 0341 02/07/23 0341 02/08/23 0412 02/09/23 0430 02/10/23 0403 02/11/23 0343  NA 134* 136 137 138 137 137  K 4.2 2.9* 4.2 3.5 4.1 3.5  CL 106 109 113* 111 109 106  CO2 20* 23 21* 23 24 26   GLUCOSE 62* 96 125* 107* 121* 114*  BUN 5* <5* <5* <5* <5* <5*  CREATININE 0.48 0.45 0.39* 0.41* 0.50 0.49  CALCIUM 7.6* 7.2* 7.7* 8.1* 8.2* 8.5*  MG 1.7 2.3 1.8 1.7 1.8  --     Liver Function Tests: Recent Labs  Lab 02/06/23 0341 02/07/23 0341 02/08/23 0412 02/10/23 0403 02/11/23 0343  AST 27 22 19 22 18   ALT 19 15 13 15 14   ALKPHOS 60 57 62 66 68  BILITOT 1.0 0.6 0.4 0.5 0.5  PROT 5.4* 5.0* 4.8* 5.1* 5.2*  ALBUMIN 2.9* 2.5* 2.3* 2.2* 2.2*    CBG: Recent Labs  Lab 02/09/23 0808 02/09/23 1228  GLUCAP 108* 101*    Microbiology Studies:  No results found for this or any previous visit (from the past 240 hour(s)).  Radiology Studies:  DG Chest  2 View  Result Date: 02/11/2023 CLINICAL DATA:  Cough.  History of smoking EXAM: CHEST - 2 VIEW COMPARISON:  08/12/2020 FINDINGS: Patchy reticulation of lung markings. Vague nodular density over the right upper chest measuring 1 cm. There is no edema, consolidation, effusion, or pneumothorax. Normal heart size and mediastinal contours. Prominent atherosclerosis. IMPRESSION: 1. Findings of COPD without acute superimposed disease. 2. Vague 1 cm nodule over the right upper lobe. Recommend non emergent chest CT without contrast. Electronically Signed   By: Tiburcio Pea M.D.   On: 02/11/2023 08:16   CT ABDOMEN PELVIS W CONTRAST  Result Date: 02/10/2023 CLINICAL DATA:  Pancreatitis, acute, severe EXAM: CT ABDOMEN AND PELVIS WITH CONTRAST TECHNIQUE: Multidetector CT imaging of the abdomen and pelvis was performed using the standard protocol following bolus administration of  intravenous contrast. RADIATION DOSE REDUCTION: This exam was performed according to the departmental dose-optimization program which includes automated exposure control, adjustment of the mA and/or kV according to patient size and/or use of iterative reconstruction technique. CONTRAST:  OMNIPAQUE IOHEXOL 300 MG/ML  SOLN COMPARISON:  02/05/2023 FINDINGS: Lower chest: New patchy ground-glass opacities and interlobular septal thickening in the lower lungs since 02/05/2023 are likely infectious/inflammatory. Unchanged 7 mm left lower lobe lung nodule (4/11). Hepatobiliary: No focal liver abnormality is seen. No gallstones, gallbladder wall thickening, or biliary dilatation. Pancreas: Slightly increased peripancreatic edema and stranding compared to 02/05/2023. Calcifications in the uncinate process of the pancreas compatible with sequela of chronic pancreatitis. No evidence of pancreatic necrosis. The portal and splenic veins are patent. No V organized fluid collection. Spleen: Unremarkable. Adrenals/Urinary Tract: Normal adrenal glands. No urinary calculi or hydronephrosis. Unremarkable bladder. Stomach/Bowel: Normal caliber large and small bowel. Colonic diverticulosis without diverticulitis. The appendix is normal. Diffuse intramural fatty deposition up to the level of the distal descending colon can be a normal finding or due to sequela of chronic inflammation. Mild diffuse wall thickening in the colon. No pericolonic fat stranding for pneumatosis. Vascular/Lymphatic: Aortic atherosclerosis. No enlarged abdominal or pelvic lymph nodes. Reproductive: Hysterectomy. Other: No free intraperitoneal air.  No abdominal wall hernia. Musculoskeletal: No acute osseous abnormality. IMPRESSION: 1. Slightly increased peripancreatic edema and stranding compared to 02/05/2023, consistent with acute on chronic pancreatitis. No evidence of pancreatic necrosis or organized fluid collection. 2. New patchy ground-glass opacities  and interlobular septal thickening in the lower lungs since 02/05/2023, likely infectious/inflammatory. 3. Mild diffuse wall thickening in the colon suggestive of mild colitis. 4. Unchanged 7 mm left lower lobe lung nodule.  Per Fleischner Society Guidelines, recommend a non-contrast Chest CT at 6-12 months. If patient is high risk for malignancy, consider an additional non-contrast Chest CT at 18-24 months. If patient is low risk for malignancy, non-contrast Chest CT at 18-24 months is Optional. These guidelines do not apply to immunocompromised patients and patients with cancer. Follow up in patients with significant comorbidities as clinically warranted. For lung cancer screening, adhere to Lung-RADS guidelines. Reference: Radiology. 2017; 284(1):228-43. Aortic Atherosclerosis (ICD10-I70.0). Electronically Signed   By: Minerva Fester M.D.   On: 02/10/2023 18:25    Scheduled Meds:    cyanocobalamin  1,000 mcg Oral Daily   enoxaparin (LOVENOX) injection  40 mg Subcutaneous Q24H   folic acid  1 mg Oral Daily   multivitamin with minerals  1 tablet Oral Daily   nicotine  14 mg Transdermal Daily   [START ON 02/12/2023] pantoprazole  40 mg Oral Daily   simethicone  80 mg Oral QID   thiamine  250 mg Oral Daily    Continuous Infusions:    dextrose 5 % and 0.9% NaCl 50 mL/hr (02/11/23 1409)     LOS: 5 days     Marcellus Scott, MD,  FACP, [facility], [facility], [facility], [facility]   [facility] [place]     To contact the attending provider between 7A-7P or the covering provider during after hours 7P-7A, please log into the web site [email or web address] and access using universal [place] password for that web site. If you do not have the password, please call the hospital operator.  02/11/2023, 4:16 PM

## 2023-02-11 NOTE — Progress Notes (Signed)
[facility] Gastroenterology Progress Note  Deborah Peterson 57 y.o. May 07, 1966   Subjective: Sleeping comfortably. Abdominal pain less today.  Objective: Vital signs: Vitals:   02/11/23 0551 02/11/23 1328  BP: 115/66 (!) 94/53  Pulse: 82 70  Resp: 17 16  Temp: 99.1 F (37.3 C) 98.4 F (36.9 C)  SpO2: 94% 96%    Physical Exam: Gen: lethargic, no acute distress, well-nourished HEENT: anicteric sclera CV: RRR Chest: CTA B Abd: epigastric and RUQ tenderness with guarding, soft, nondistended, +BS Ext: no edema  Lab Results: Recent Labs    02/09/23 0430 02/10/23 0403 02/11/23 0343  NA 138 137 137  K 3.5 4.1 3.5  CL 111 109 106  CO2 23 24 26   GLUCOSE 107* 121* 114*  BUN <5* <5* <5*  CREATININE 0.41* 0.50 0.49  CALCIUM 8.1* 8.2* 8.5*  MG 1.7 1.8  --    Recent Labs    02/10/23 0403 02/11/23 0343  AST 22 18  ALT 15 14  ALKPHOS 66 68  BILITOT 0.5 0.5  PROT 5.1* 5.2*  ALBUMIN 2.2* 2.2*   Recent Labs    02/09/23 0430 02/11/23 0343  WBC 5.6 5.4  NEUTROABS 3.6  --   HGB 10.5* 10.3*  HCT 31.5* 30.3*  MCV 108.2* 106.3*  PLT 191 206      Assessment/Plan: 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 Continue full liquid diet and if tolerates for next 24 hours then advance to low fat diet. Hopefully she will stop drinking alcohol, which she says she plans to. Will sign off. Call if questions. F/U with [facility] in 6-8 weeks.    Shirley Friar 02/11/2023, 3:36 PM  Questions please call [phone] ID: Arlyn Dunning, female   DOB: [birth year], 57 y.o.   MRN: [id number]

## 2023-02-11 NOTE — Plan of Care (Signed)

## 2023-02-12 DIAGNOSIS — K852 Alcohol induced acute pancreatitis without necrosis or infection: Secondary | ICD-10-CM | POA: Diagnosis not present

## 2023-02-12 NOTE — Progress Notes (Signed)
PROGRESS NOTE   Deborah Peterson  BWG:[id number]    DOB: [birth year]    DOA: 02/05/2023  PCP: Gerre Scull, NP   I have briefly reviewed patients previous medical records in [facility].  Chief Complaint  Patient presents with   Abdominal Pain    Brief Narrative:  57 year old female with medical history significant for GERD, alcohol use disorder, tobacco use, anxiety, prior history of gastric ulcers presented with 4-day history of significant epigastric abdominal pain.  Noted to have elevated lipase of 519 and CT abdomen and pelvis consistent with acute pancreatitis.  Admitted for acute alcoholic pancreatitis.  Despite supportive treatment, initially appeared to make slow improvement but today reports that her epigastric abdominal pain may be slightly worse compared to the day prior.  [facility] consulted for assistance.  Repeated CT abdomen-no pancreatic necrosis or pseudocyst.  Continues to slowly improve again.   Assessment & Plan:  Principal Problem:   Acute pancreatitis Active Problems:   Anxiety   Gastroesophageal reflux disease   Hypokalemia   Leukocytosis   Pulmonary nodule   Alcohol abuse   Acute alcoholic pancreatitis: CT A/P with contrast 3/30: Acute pancreatitis without pancreatic necrosis or fluid collections.  Lipase 519 which has since come down to 51.  LFTs otherwise unremarkable.  Triglycerides 59.  Treating supportively with bowel rest, IV fluids, pain management.  Despite supportive treatment, initially appeared to make slow improvement but then reported worsening abdominal pain.  Repeat LFTs and lipase unremarkable.  Repeat CT abdomen without pancreatic necrosis or pseudocyst.  [facility] consulted and input appreciated.  Tolerating full liquids/soup last night.  States that she usually does not have breakfast or lunch at home and only eats dinner.  Advance to soft diet today and if tolerates, possible DC home later this evening or  tomorrow.  Cough: Started overnight 4/3.  Currently without clinical concern for pneumonia or volume overload.  Supportive treatment with cough medicines.  CT abdomen 4/4 suggested pneumonia findings in the basal aspect of lungs.  Cough however better today, no dyspnea, fever or leukocytosis.  Obtain chest x-ray and negative for pneumonia.  Holding off antibiotics.  Continue incentive spirometry.  Improved.  Alcohol use disorder: Noted that patient is a heavy drinker.  Reportedly drinks about 6 beers a day.  No overt withdrawal.  CIWA scores are 0.  Completed Librium taper.  Continue thiamine, folate and multivitamins.  Abstinence and eventual cessation counseled.  Thiamine levels pending.  B12 level: 593.  GERD/history of ulcer disease: Reports ulcers were diagnosed by EGD by Dr. Loreta Ave.  Continue IV PPI.  Differential diagnosis for her abdominal pain may also be PUD.    Anxiety disorder Completed Librium taper.  Hypokalemia: Replaced.  Follow and continue to replace as needed.  Magnesium up to 1.8.  Leukocytosis, reactive: Resolved.  Macrocytic anemia: Suspected due to alcohol use disorder.  Stable.  B12 levels normal.  Incidental CT findings, for outpatient follow-up: 1. Diffuse intramural fatty deposition within the colon up to the level of the distal descending colon. This is a nonspecific finding but can be seen in the setting of chronic inflammation.  Recommend outpatient follow-up with Dr. Loreta Ave and consider further evaluation as deemed necessary including colonoscopy if not recently performed. 2. Extensive sigmoid diverticulosis without signs of acute diverticulitis. 3. Left lower lobe solid pulmonary nodule measuring 7 mm.  Per radiology, recommend a non-contrast Chest CT at 6-12 months. If patient is high risk for malignancy, consider an additional non-contrast  Chest CT at 18-24 months. If patient is low risk for malignancy, non-contrast Chest CT at 18-24 months is optional.   Follow-up with PCP regarding this.  Tobacco cessation counseled.  Incidental chest x-ray findings, for outpatient follow-up: Vague 1 cm nodule over the right upper lobe, noted on chest x-ray 4/5.  As per recommendations, nonemergent CT chest without contrast, to be pursued as outpatient via PCPs office.  I have specifically discussed in detail with patient on 4/6, the above incidental findings and importance of outpatient follow-up with her outpatient providers and she verbalized understanding.  Body mass index is 23.62 kg/m.   DVT prophylaxis: enoxaparin (LOVENOX) injection 40 mg Start: 02/05/23 2200 SCDs Start: 02/05/23 1507     Code Status: Full Code:  ACP Documents: None present. Family Communication: None at bedside Disposition:  DC pending diet advancement and pain control, hopefully in the next 48 hours.     Consultants:   GI/Dr. Loreta Ave  Procedures:     Antimicrobials:      Subjective:  States pain is gradually improving, down to 6/10.  Tolerating full liquids.  Agreeable to advancing to soft diet.  States that she usually does not eat breakfast or lunch at home.  Had BM yesterday.  No cough reported.  Objective:   Vitals:   02/11/23 0551 02/11/23 1328 02/11/23 2055 02/12/23 0450  BP: 115/66 (!) 94/53 108/61 (!) 101/58  Pulse: 82 70 72 70  Resp: 17 16  18   Temp: 99.1 F (37.3 C) 98.4 F (36.9 C) 98.3 F (36.8 C) 97.7 F (36.5 C)  TempSrc:      SpO2: 94% 96% 98% 98%  Weight:      Height:        General exam: Young female, moderately built and nourished lying in bed without distress. Respiratory system: Clear to auscultation.  No increased work of breathing. Cardiovascular system: S1 & S2 heard, RRR. No JVD, murmurs, rubs, gallops or clicks. No pedal edema. Gastrointestinal system: Abdomen is nondistended, soft and nontender.  No organomegaly or masses felt. Normal bowel sounds heard.  Unchanged exam from yesterday. Central nervous system: Alert and  oriented. No focal neurological deficits. Extremities: Symmetric 5 x 5 power. Skin: No rashes, lesions or ulcers Psychiatry: Judgement and insight appear normal. Mood & affect appropriate.     Data Reviewed:   I have personally reviewed following labs and imaging studies   CBC: Recent Labs  Lab 02/07/23 0341 02/08/23 0412 02/09/23 0430 02/11/23 0343  WBC 7.5 5.7 5.6 5.4  NEUTROABS 5.3 3.7 3.6  --   HGB 10.8* 10.4* 10.5* 10.3*  HCT 32.6* 31.7* 31.5* 30.3*  MCV 107.6* 108.2* 108.2* 106.3*  PLT 168 159 191 206    Basic Metabolic Panel: Recent Labs  Lab 02/06/23 0341 02/07/23 0341 02/08/23 0412 02/09/23 0430 02/10/23 0403 02/11/23 0343  NA 134* 136 137 138 137 137  K 4.2 2.9* 4.2 3.5 4.1 3.5  CL 106 109 113* 111 109 106  CO2 20* 23 21* 23 24 26   GLUCOSE 62* 96 125* 107* 121* 114*  BUN 5* <5* <5* <5* <5* <5*  CREATININE 0.48 0.45 0.39* 0.41* 0.50 0.49  CALCIUM 7.6* 7.2* 7.7* 8.1* 8.2* 8.5*  MG 1.7 2.3 1.8 1.7 1.8  --     Liver Function Tests: Recent Labs  Lab 02/06/23 0341 02/07/23 0341 02/08/23 0412 02/10/23 0403 02/11/23 0343  AST 27 22 19 22 18   ALT 19 15 13 15 14   ALKPHOS 60 57 62 66  68  BILITOT 1.0 0.6 0.4 0.5 0.5  PROT 5.4* 5.0* 4.8* 5.1* 5.2*  ALBUMIN 2.9* 2.5* 2.3* 2.2* 2.2*    CBG: Recent Labs  Lab 02/09/23 0808 02/09/23 1228  GLUCAP 108* 101*    Microbiology Studies:  No results found for this or any previous visit (from the past 240 hour(s)).  Radiology Studies:  DG Chest 2 View  Result Date: 02/11/2023 CLINICAL DATA:  Cough.  History of smoking EXAM: CHEST - 2 VIEW COMPARISON:  08/12/2020 FINDINGS: Patchy reticulation of lung markings. Vague nodular density over the right upper chest measuring 1 cm. There is no edema, consolidation, effusion, or pneumothorax. Normal heart size and mediastinal contours. Prominent atherosclerosis. IMPRESSION: 1. Findings of COPD without acute superimposed disease. 2. Vague 1 cm nodule over the right  upper lobe. Recommend non emergent chest CT without contrast. Electronically Signed   By: Tiburcio Pea M.D.   On: 02/11/2023 08:16   CT ABDOMEN PELVIS W CONTRAST  Result Date: 02/10/2023 CLINICAL DATA:  Pancreatitis, acute, severe EXAM: CT ABDOMEN AND PELVIS WITH CONTRAST TECHNIQUE: Multidetector CT imaging of the abdomen and pelvis was performed using the standard protocol following bolus administration of intravenous contrast. RADIATION DOSE REDUCTION: This exam was performed according to the departmental dose-optimization program which includes automated exposure control, adjustment of the mA and/or kV according to patient size and/or use of iterative reconstruction technique. CONTRAST:  OMNIPAQUE IOHEXOL 300 MG/ML  SOLN COMPARISON:  02/05/2023 FINDINGS: Lower chest: New patchy ground-glass opacities and interlobular septal thickening in the lower lungs since 02/05/2023 are likely infectious/inflammatory. Unchanged 7 mm left lower lobe lung nodule (4/11). Hepatobiliary: No focal liver abnormality is seen. No gallstones, gallbladder wall thickening, or biliary dilatation. Pancreas: Slightly increased peripancreatic edema and stranding compared to 02/05/2023. Calcifications in the uncinate process of the pancreas compatible with sequela of chronic pancreatitis. No evidence of pancreatic necrosis. The portal and splenic veins are patent. No V organized fluid collection. Spleen: Unremarkable. Adrenals/Urinary Tract: Normal adrenal glands. No urinary calculi or hydronephrosis. Unremarkable bladder. Stomach/Bowel: Normal caliber large and small bowel. Colonic diverticulosis without diverticulitis. The appendix is normal. Diffuse intramural fatty deposition up to the level of the distal descending colon can be a normal finding or due to sequela of chronic inflammation. Mild diffuse wall thickening in the colon. No pericolonic fat stranding for pneumatosis. Vascular/Lymphatic: Aortic atherosclerosis. No  enlarged abdominal or pelvic lymph nodes. Reproductive: Hysterectomy. Other: No free intraperitoneal air.  No abdominal wall hernia. Musculoskeletal: No acute osseous abnormality. IMPRESSION: 1. Slightly increased peripancreatic edema and stranding compared to 02/05/2023, consistent with acute on chronic pancreatitis. No evidence of pancreatic necrosis or organized fluid collection. 2. New patchy ground-glass opacities and interlobular septal thickening in the lower lungs since 02/05/2023, likely infectious/inflammatory. 3. Mild diffuse wall thickening in the colon suggestive of mild colitis. 4. Unchanged 7 mm left lower lobe lung nodule.  Per Fleischner Society Guidelines, recommend a non-contrast Chest CT at 6-12 months. If patient is high risk for malignancy, consider an additional non-contrast Chest CT at 18-24 months. If patient is low risk for malignancy, non-contrast Chest CT at 18-24 months is Optional. These guidelines do not apply to immunocompromised patients and patients with cancer. Follow up in patients with significant comorbidities as clinically warranted. For lung cancer screening, adhere to Lung-RADS guidelines. Reference: Radiology. 2017; 284(1):228-43. Aortic Atherosclerosis (ICD10-I70.0). Electronically Signed   By: Minerva Fester M.D.   On: 02/10/2023 18:25    Scheduled Meds:    cyanocobalamin  1,000 mcg Oral Daily   enoxaparin (LOVENOX) injection  40 mg Subcutaneous Q24H   folic acid  1 mg Oral Daily   multivitamin with minerals  1 tablet Oral Daily   nicotine  14 mg Transdermal Daily   pantoprazole  40 mg Oral Daily   simethicone  80 mg Oral QID   thiamine  250 mg Oral Daily    Continuous Infusions:    dextrose 5 % and 0.9% NaCl 50 mL/hr (02/11/23 1409)     LOS: 6 days     Marcellus ScottAnand Margrete Delude, MD,  FACP, FHM, [facility] HospitalFHM, [facility]CMPC, [facility] HospitalCHCQM-PHYADV   [facility] [place]     To contact the attending provider between 7A-7P or the covering provider  during after hours 7P-7A, please log into the web site [email or web address] and access using universal [place] password for that web site. If you do not have the password, please call the hospital operator.  02/12/2023, 12:36 PM

## 2023-02-13 DIAGNOSIS — K852 Alcohol induced acute pancreatitis without necrosis or infection: Secondary | ICD-10-CM | POA: Diagnosis not present

## 2023-02-13 MED ORDER — BENZONATATE 100 MG PO CAPS: 100.0000 mg | ORAL_CAPSULE | Freq: Three times a day (TID) | ORAL | 0 refills | Status: DC | PRN

## 2023-02-13 MED ORDER — OXYCODONE HCL 10 MG PO TABS: 5.0000 mg | ORAL_TABLET | Freq: Three times a day (TID) | ORAL | 0 refills | Status: DC | PRN

## 2023-02-13 MED ORDER — NICOTINE 14 MG/24HR TD PT24: 14.0000 mg | MEDICATED_PATCH | Freq: Every day | TRANSDERMAL | 0 refills | Status: DC

## 2023-02-13 MED ORDER — FOLIC ACID 1 MG PO TABS: 1.0000 mg | ORAL_TABLET | Freq: Every day | ORAL | 0 refills | Status: AC

## 2023-02-13 MED ORDER — ADULT MULTIVITAMIN W/MINERALS CH: 1.0000 | ORAL_TABLET | Freq: Every day | ORAL | Status: AC

## 2023-02-13 NOTE — Discharge Summary (Addendum)
Physician Discharge Summary  Deborah DunningJulie S Peterson ZOX:[id number] DOB: 08-11-1966  PCP: Gerre ScullMcElwee, Lauren A, NP  Admitted from: Home Discharged to: Home  Admit date: 02/05/2023 Discharge date: 02/13/2023  Recommendations for Outpatient Follow-up:    Follow-up Information     Gerre ScullMcElwee, Lauren A, NP. Schedule an appointment as soon as possible for a visit in 1 week(s).   Specialty: Internal Medicine Why: To be seen with repeat labs (CBC & CMP). Contact information: [address] [place] [address] [phone]         Charlott RakesSchooler, Vincent, MD. Schedule an appointment as soon as possible for a visit.   Specialty: Gastroenterology Why: To be seen in 6 to 8 weeks. Contact information: [address] [place] [address] [phone]                  Home Health: None    Equipment/Devices: None    Discharge Condition: Improved and stable.   Code Status: Full Code Diet recommendation:  Discharge Diet Orders (From admission, onward)     Start     Ordered   02/13/23 0000  Diet - low sodium heart healthy       Comments: Advised low-fat soft diet for several days and then can advance to regular consistency diet thereafter as she tolerates.   02/13/23 1142             Discharge Diagnoses:  Principal Problem:   Acute pancreatitis Active Problems:   Anxiety   Gastroesophageal reflux disease   Hypokalemia   Leukocytosis   Pulmonary nodule   Alcohol abuse   Brief Summary: 57 year old female with medical history significant for GERD, alcohol use disorder, tobacco use, anxiety, prior history of gastric ulcers presented with 4-day history of significant epigastric abdominal pain.  Noted to have elevated lipase of 519 and CT abdomen and pelvis consistent with acute pancreatitis.  Admitted for acute alcoholic pancreatitis.  Despite supportive treatment, initially appeared to make slow improvement but today reports that her epigastric abdominal  pain may be slightly worse compared to the day prior.  [facility] consulted for assistance.  Repeated CT abdomen-no pancreatic necrosis or pseudocyst.  Diet was gradually advanced, pain control improved.  Clinically improved.     Assessment & Plan:    Acute alcoholic pancreatitis on chronic pancreatitis: CT A/P with contrast 3/30: Acute pancreatitis without pancreatic necrosis or fluid collections.  Lipase 519 which has since come down to 51 >31.  LFTs otherwise unremarkable.  Triglycerides 59.  Treated supportively with bowel rest, IV fluids, pain management.  Despite supportive treatment, initially appeared to make slow improvement but then reported worsening abdominal pain.  Repeat LFTs and lipase unremarkable.  Repeat CT abdomen without pancreatic necrosis or pseudocyst.  [facility] consulted and input appreciated.  Diet was gradually advanced which she has tolerated.  Tolerating soft food.  Having BMs.  Ambulating independently without difficulty.  Has not used IV opioids in 3 days and pain controlled on Oxy IR as needed.  Clinically improved.  At time of discharge again extensively counseled regarding importance of alcohol cessation, gradually advancing diet at home.   Cough: Started overnight 4/3.  Currently without clinical concern for pneumonia or volume overload.  Supportive treatment with cough medicines.  CT abdomen 4/4 suggested pneumonia findings in the basal aspect of lungs.  Cough however better today, no dyspnea, fever or leukocytosis.  Obtain chest x-ray and negative for pneumonia.  Holding off antibiotics.  Continue incentive spirometry.  Improved.   Alcohol use disorder: Noted that patient is a heavy drinker.  Reportedly drinks about 6 beers a day.  No overt withdrawal.  CIWA scores are 0.  Completed Librium taper.  Continue folate and multivitamins.  No need of thiamine supplement (had quit taking thiamine supplements) since thiamine level: 114.  Abstinence and eventual cessation  counseled.  B12 level: 593, does not take B12 supplements either.   GERD/history of ulcer disease: Reports ulcers were diagnosed by EGD by Dr. Loreta Ave.  Continue PPI.  CT abdomen and chest x-ray such as some findings in the colon, lung nodules   Anxiety disorder Completed Librium taper.   Hypokalemia: Replaced.  Follow and continue to replace as needed.  Magnesium up to 1.8.   Leukocytosis, reactive: Resolved.   Macrocytic anemia: Suspected due to alcohol use disorder.  Stable.  B12 levels normal.   Incidental CT findings, for outpatient follow-up: 1. Diffuse intramural fatty deposition within the colon up to the level of the distal descending colon. This is a nonspecific finding but can be seen in the setting of chronic inflammation.  Recommend outpatient follow-up with Dr. Loreta Ave and consider further evaluation as deemed necessary including colonoscopy if not recently performed. 2. Extensive sigmoid diverticulosis without signs of acute diverticulitis. 3. Left lower lobe solid pulmonary nodule measuring 7 mm.  Per radiology, recommend a non-contrast Chest CT at 6-12 months. If patient is high risk for malignancy, consider an additional non-contrast Chest CT at 18-24 months. If patient is low risk for malignancy, non-contrast Chest CT at 18-24 months is optional.  Follow-up with PCP regarding this.  Tobacco cessation counseled.   Incidental chest x-ray findings, for outpatient follow-up: Vague 1 cm nodule over the right upper lobe, noted on chest x-ray 4/5.  As per recommendations, nonemergent CT chest without contrast, to be pursued as outpatient via PCPs office.   I have specifically discussed in detail with patient on 4/6, the above incidental findings and importance of outpatient follow-up with her outpatient providers and she verbalized understanding.  Added to her DC instructions as well.   Body mass index is 23.62 kg/m.    Consultants:   [facility]   Procedures:     Discharge  Instructions  Discharge Instructions     Call MD for:  difficulty breathing, headache or visual disturbances   Complete by: As directed    Call MD for:  extreme fatigue   Complete by: As directed    Call MD for:  persistant dizziness or light-headedness   Complete by: As directed    Call MD for:  persistant nausea and vomiting   Complete by: As directed    Call MD for:  severe uncontrolled pain   Complete by: As directed    Call MD for:  temperature >100.4   Complete by: As directed    Diet - low sodium heart healthy   Complete by: As directed    Advised low-fat soft diet for several days and then can advance to regular consistency diet thereafter as she tolerates.   Increase activity slowly   Complete by: As directed         Medication List     STOP taking these medications    cyanocobalamin 1000 MCG tablet   thiamine 250 MG tablet   Vitamin D (Ergocalciferol) 1.25 MG (50000 UNIT) Caps capsule Commonly known as: DRISDOL       TAKE these medications    acetaminophen 500 MG tablet Commonly known as: TYLENOL  Take 500 mg by mouth every 8 (eight) hours as needed for moderate pain.   benzonatate 100 MG capsule Commonly known as: TESSALON Take 1 capsule (100 mg total) by mouth 3 (three) times daily as needed for cough. What changed: reasons to take this   folic acid 1 MG tablet Commonly known as: FOLVITE Take 1 tablet (1 mg total) by mouth daily. Start taking on: February 14, 2023   multivitamin with minerals Tabs tablet Take 1 tablet by mouth daily. Start taking on: February 14, 2023   nicotine 14 mg/24hr patch Commonly known as: NICODERM CQ - dosed in mg/24 hours Place 1 patch (14 mg total) onto the skin daily. Start taking on: February 14, 2023   Oxycodone HCl 10 MG Tabs Take 0.5-1 tablets (5-10 mg total) by mouth every 8 (eight) hours as needed for severe pain.   pantoprazole 40 MG tablet Commonly known as: PROTONIX Take 40 mg by mouth daily.        Allergies  Allergen Reactions   Erythromycin Anaphylaxis   Motrin [Ibuprofen]     unknown   Penicillins     unknown   Tramadol     unknown   Ciprofloxacin Itching      Procedures/Studies: DG Chest 2 View  Result Date: 02/11/2023 CLINICAL DATA:  Cough.  History of smoking EXAM: CHEST - 2 VIEW COMPARISON:  08/12/2020 FINDINGS: Patchy reticulation of lung markings. Vague nodular density over the right upper chest measuring 1 cm. There is no edema, consolidation, effusion, or pneumothorax. Normal heart size and mediastinal contours. Prominent atherosclerosis. IMPRESSION: 1. Findings of COPD without acute superimposed disease. 2. Vague 1 cm nodule over the right upper lobe. Recommend non emergent chest CT without contrast. Electronically Signed   By: Tiburcio Pea M.D.   On: 02/11/2023 08:16   CT ABDOMEN PELVIS W CONTRAST  Result Date: 02/10/2023 CLINICAL DATA:  Pancreatitis, acute, severe EXAM: CT ABDOMEN AND PELVIS WITH CONTRAST TECHNIQUE: Multidetector CT imaging of the abdomen and pelvis was performed using the standard protocol following bolus administration of intravenous contrast. RADIATION DOSE REDUCTION: This exam was performed according to the departmental dose-optimization program which includes automated exposure control, adjustment of the mA and/or kV according to patient size and/or use of iterative reconstruction technique. CONTRAST:  OMNIPAQUE IOHEXOL 300 MG/ML  SOLN COMPARISON:  02/05/2023 FINDINGS: Lower chest: New patchy ground-glass opacities and interlobular septal thickening in the lower lungs since 02/05/2023 are likely infectious/inflammatory. Unchanged 7 mm left lower lobe lung nodule (4/11). Hepatobiliary: No focal liver abnormality is seen. No gallstones, gallbladder wall thickening, or biliary dilatation. Pancreas: Slightly increased peripancreatic edema and stranding compared to 02/05/2023. Calcifications in the uncinate process of the pancreas compatible with  sequela of chronic pancreatitis. No evidence of pancreatic necrosis. The portal and splenic veins are patent. No V organized fluid collection. Spleen: Unremarkable. Adrenals/Urinary Tract: Normal adrenal glands. No urinary calculi or hydronephrosis. Unremarkable bladder. Stomach/Bowel: Normal caliber large and small bowel. Colonic diverticulosis without diverticulitis. The appendix is normal. Diffuse intramural fatty deposition up to the level of the distal descending colon can be a normal finding or due to sequela of chronic inflammation. Mild diffuse wall thickening in the colon. No pericolonic fat stranding for pneumatosis. Vascular/Lymphatic: Aortic atherosclerosis. No enlarged abdominal or pelvic lymph nodes. Reproductive: Hysterectomy. Other: No free intraperitoneal air.  No abdominal wall hernia. Musculoskeletal: No acute osseous abnormality. IMPRESSION: 1. Slightly increased peripancreatic edema and stranding compared to 02/05/2023, consistent with acute on chronic pancreatitis. No evidence  of pancreatic necrosis or organized fluid collection. 2. New patchy ground-glass opacities and interlobular septal thickening in the lower lungs since 02/05/2023, likely infectious/inflammatory. 3. Mild diffuse wall thickening in the colon suggestive of mild colitis. 4. Unchanged 7 mm left lower lobe lung nodule.  Per Fleischner Society Guidelines, recommend a non-contrast Chest CT at 6-12 months. If patient is high risk for malignancy, consider an additional non-contrast Chest CT at 18-24 months. If patient is low risk for malignancy, non-contrast Chest CT at 18-24 months is Optional. These guidelines do not apply to immunocompromised patients and patients with cancer. Follow up in patients with significant comorbidities as clinically warranted. For lung cancer screening, adhere to Lung-RADS guidelines. Reference: Radiology. 2017; 284(1):228-43. Aortic Atherosclerosis (ICD10-I70.0). Electronically Signed   By: Minerva Fester M.D.   On: 02/10/2023 18:25   CT ABDOMEN PELVIS W CONTRAST  Result Date: 02/05/2023 CLINICAL DATA:  Pancreatitis, acute and severe. EXAM: CT ABDOMEN AND PELVIS WITH CONTRAST TECHNIQUE: Multidetector CT imaging of the abdomen and pelvis was performed using the standard protocol following bolus administration of intravenous contrast. RADIATION DOSE REDUCTION: This exam was performed according to the departmental dose-optimization program which includes automated exposure control, adjustment of the mA and/or kV according to patient size and/or use of iterative reconstruction technique. CONTRAST:  OMNIPAQUE IOHEXOL 300 MG/ML  SOLN COMPARISON:  None Available. FINDINGS: Lower chest: 7 mm left lower lobe lung nodule identified, image 5/6. No pleural fluid or consolidation. Hepatobiliary: No focal liver abnormality is seen. No gallstones, gallbladder wall thickening, or biliary dilatation. Pancreas: There is pancreatic edema with diffuse peripancreatic fat stranding compatible with the history of acute pancreatitis. Calcifications identified within the uncinate process of the pancreas. No signs of pancreatic necrosis. No focal fluid collections identified. Spleen: Normal in size without focal abnormality. Adrenals/Urinary Tract: Normal adrenal glands. No kidney mass or hydronephrosis. No nephrolithiasis. Urinary bladder is unremarkable. Stomach/Bowel: Small hiatal hernia. No pathologic dilatation of the large or small bowel loops. There is diffuse intramural fatty deposition up to the level of the distal descending colon. The colon appears diffusely decompressed with mild diffuse wall thickening. No pericolonic fat stranding or signs of pneumatosis. Extensive sigmoid diverticulosis without signs of acute diverticulitis. Vascular/Lymphatic: Extensive aortic atherosclerosis. Upper abdominal vascularity remains patent. No signs of abdominopelvic adenopathy. Reproductive: Status post hysterectomy. No adnexal  masses. Other: There is a small amount of fluid surrounding the head and uncinate process of pancreas. No discrete fluid collections identified within the abdomen or pelvis. No signs of pneumoperitoneum. Musculoskeletal: No acute or significant osseous findings. IMPRESSION: 1. Acute pancreatitis. No signs of pancreatic necrosis or focal fluid collections identified. 2. Diffuse intramural fatty deposition within the colon up to the level of the distal descending colon. This is a nonspecific finding but can be seen in the setting of chronic inflammation. 3. Extensive sigmoid diverticulosis without signs of acute diverticulitis. 4. Left solid pulmonary nodule measuring 7 mm. Per Fleischner Society Guidelines, recommend a non-contrast Chest CT at 6-12 months. If patient is high risk for malignancy, consider an additional non-contrast Chest CT at 18-24 months. If patient is low risk for malignancy, non-contrast Chest CT at 18-24 months is optional. These guidelines do not apply to immunocompromised patients and patients with cancer. Follow up in patients with significant comorbidities as clinically warranted. For lung cancer screening, adhere to Lung-RADS guidelines. Reference: Radiology. 2017; 284(1):228-43. 5. Small hiatal hernia. 6.  Aortic Atherosclerosis (ICD10-I70.0). Electronically Signed   By: Veronda Prude.D.  On: 02/05/2023 11:04      Subjective: Reports overall continuing to feel better.  Tolerating soft diet.  Had BM this morning.  No nausea or vomiting.  Ambulating independently per nursing.  Discharge Exam:  Vitals:   02/12/23 0450 02/12/23 1238 02/12/23 1922 02/13/23 0528  BP: (!) 101/58 (!) 115/56 111/62 (!) 101/59  Pulse: 70 68 71 69  Resp: 18 16 15 16   Temp: 97.7 F (36.5 C) 98.3 F (36.8 C) 98.8 F (37.1 C) 98.4 F (36.9 C)  TempSrc:      SpO2: 98% 96% 98% 94%  Weight:    54.4 kg  Height:        General exam: Young female, moderately built and nourished lying in bed without  distress. Respiratory system: Clear to auscultation.  No increased work of breathing. Cardiovascular system: S1 & S2 heard, RRR. No JVD, murmurs, rubs, gallops or clicks. No pedal edema. Gastrointestinal system: Abdomen is nondistended, soft and nontender. No organomegaly or masses felt. Normal bowel sounds heard.  Unchanged exam from yesterday. Central nervous system: Alert and oriented. No focal neurological deficits. Extremities: Symmetric 5 x 5 power. Skin: No rashes, lesions or ulcers Psychiatry: Judgement and insight appear normal. Mood & affect appropriate.     The results of significant diagnostics from this hospitalization (including imaging, microbiology, ancillary and laboratory) are listed below for reference.     Microbiology: No results found for this or any previous visit (from the past 240 hour(s)).   Labs: CBC: Recent Labs  Lab 02/07/23 0341 02/08/23 0412 02/09/23 0430 02/11/23 0343  WBC 7.5 5.7 5.6 5.4  NEUTROABS 5.3 3.7 3.6  --   HGB 10.8* 10.4* 10.5* 10.3*  HCT 32.6* 31.7* 31.5* 30.3*  MCV 107.6* 108.2* 108.2* 106.3*  PLT 168 159 191 206    Basic Metabolic Panel: Recent Labs  Lab 02/07/23 0341 02/08/23 0412 02/09/23 0430 02/10/23 0403 02/11/23 0343  NA 136 137 138 137 137  K 2.9* 4.2 3.5 4.1 3.5  CL 109 113* 111 109 106  CO2 23 21* 23 24 26   GLUCOSE 96 125* 107* 121* 114*  BUN <5* <5* <5* <5* <5*  CREATININE 0.45 0.39* 0.41* 0.50 0.49  CALCIUM 7.2* 7.7* 8.1* 8.2* 8.5*  MG 2.3 1.8 1.7 1.8  --     Liver Function Tests: Recent Labs  Lab 02/07/23 0341 02/08/23 0412 02/10/23 0403 02/11/23 0343  AST 22 19 22 18   ALT 15 13 15 14   ALKPHOS 57 62 66 68  BILITOT 0.6 0.4 0.5 0.5  PROT 5.0* 4.8* 5.1* 5.2*  ALBUMIN 2.5* 2.3* 2.2* 2.2*    CBG: Recent Labs  Lab 02/09/23 0808 02/09/23 1228  GLUCAP 108* 101*    Offered but patient kindly declined MDs offer to speak with family to update care.   Time coordinating discharge: 25  minutes  SIGNED:  Marcellus Scott, MD,  FACP, [facility], [facility], [facility], [facility]   [facility] Ad[facility] [place]     To contact the attending provider between 7A-7P or the covering provider during after hours 7P-7A, please log into the web site [email or web address] and access using universal [place] password for that web site. If you do not have the password, please call the hospital operator.

## 2023-02-15 ENCOUNTER — Telehealth: Payer: Self-pay

## 2023-02-15 NOTE — Transitions of Care (Post Inpatient/ED Visit) (Unsigned)
   02/15/2023  Name: Deborah Peterson MRN: [id number] DOB: 23-Oct-1966  Today's TOC FU Call Status: Today's TOC FU Call Status:: Unsuccessul Call (1st Attempt) Unsuccessful Call (1st Attempt) Date: 02/15/23  Attempted to reach the patient regarding the most recent Inpatient/ED visit.  Follow Up Plan: Additional outreach attempts will be made to reach the patient to complete the Transitions of Care (Post Inpatient/ED visit) call.   Signature Karena Addison, LPN [facility] Nurse Health Advisor Direct Dial [phone]

## 2023-02-16 NOTE — Transitions of Care (Post Inpatient/ED Visit) (Unsigned)
   02/16/2023  Name: Deborah Peterson MRN: [id number] DOB: 14-Nov-1965  Today's TOC FU Call Status: Today's TOC FU Call Status:: Unsuccessful Call (2nd Attempt) Unsuccessful Call (1st Attempt) Date: 02/15/23 Unsuccessful Call (2nd Attempt) Date: 02/16/23  Attempted to reach the patient regarding the most recent Inpatient/ED visit.  Follow Up Plan: Additional outreach attempts will be made to reach the patient to complete the Transitions of Care (Post Inpatient/ED visit) call.   Signature Karena Addison, LPN [facility] Nurse Health Advisor Direct Dial [phone]

## 2023-02-17 NOTE — Transitions of Care (Post Inpatient/ED Visit) (Signed)
   02/17/2023  Name: Deborah Peterson MRN: [id number] DOB: [birth year]  Today's TOC FU Call Status: Today's TOC FU Call Status:: Successful TOC FU Call Competed Unsuccessful Call (1st Attempt) Date: 02/15/23 Unsuccessful Call (2nd Attempt) Date: 02/16/23 [facility] FU Call Complete Date: 02/17/23  Transition Care Management Follow-up Telephone Call Date of Discharge: 02/13/23 Discharge Facility: [facility]) Type of Discharge: Inpatient Admission Primary Inpatient Discharge Diagnosis:: pancreatitis How have you been since you were released from the hospital?: Better Any questions or concerns?: No  Items Reviewed: Did you receive and understand the discharge instructions provided?: Yes Medications obtained and verified?: Yes (Medications Reviewed) Any new allergies since your discharge?: No Dietary orders reviewed?: Yes Do you have support at home?: Yes People in Home: child(ren), adult  Home Care and Equipment/Supplies: Were Home Health Services Ordered?: NA Any new equipment or medical supplies ordered?: NA  Functional Questionnaire: Do you need assistance with bathing/showering or dressing?: No Do you need assistance with meal preparation?: No Do you need assistance with eating?: No Do you have difficulty maintaining continence: No Do you need assistance with getting out of bed/getting out of a chair/moving?: No Do you have difficulty managing or taking your medications?: No  Follow up appointments reviewed: PCP Follow-up appointment confirmed?: No (not sure if this office take her insurance, she will call office) Specialist Hospital Follow-up appointment confirmed?: No Follow-Up Specialty Provider:: GI [facility] Specialist Follow-Up Not Confirmed: Patient has Specialist Provider Number and will Call for Appointment Do you need transportation to your follow-up appointment?: No Do you understand care options if your condition(s) worsen?: Yes-patient verbalized  understanding    SIGNATURE Karena Addison, LPN [facility] Nurse Health Advisor Direct Dial [phone]

## 2023-02-17 NOTE — Telephone Encounter (Signed)
Noted  

## 2023-02-18 ENCOUNTER — Encounter: Payer: Self-pay | Admitting: Nurse Practitioner

## 2023-02-18 ENCOUNTER — Ambulatory Visit (INDEPENDENT_AMBULATORY_CARE_PROVIDER_SITE_OTHER): Payer: 59 | Admitting: Nurse Practitioner

## 2023-02-18 VITALS — BP 134/82 | HR 98 | Temp 98.6°F | Ht 61.0 in | Wt 108.2 lb

## 2023-02-18 DIAGNOSIS — Z720 Tobacco use: Secondary | ICD-10-CM | POA: Diagnosis not present

## 2023-02-18 DIAGNOSIS — F1010 Alcohol abuse, uncomplicated: Secondary | ICD-10-CM

## 2023-02-18 DIAGNOSIS — R911 Solitary pulmonary nodule: Secondary | ICD-10-CM | POA: Diagnosis not present

## 2023-02-18 DIAGNOSIS — K860 Alcohol-induced chronic pancreatitis: Secondary | ICD-10-CM | POA: Diagnosis not present

## 2023-02-18 DIAGNOSIS — R7301 Impaired fasting glucose: Secondary | ICD-10-CM

## 2023-02-18 MED ORDER — SIMETHICONE 80 MG PO CHEW: 80.0000 mg | CHEWABLE_TABLET | Freq: Four times a day (QID) | ORAL | 0 refills | Status: AC | PRN

## 2023-02-18 MED ORDER — OXYCODONE HCL 5 MG PO CAPS: 5.0000 mg | ORAL_CAPSULE | Freq: Four times a day (QID) | ORAL | 0 refills | Status: DC | PRN

## 2023-02-18 NOTE — Assessment & Plan Note (Signed)
She had a CT of her abdomen and pelvis which showed a 7 mm left lower lobe nodule.  Since she is a smoker, recommend that we follow-up with a repeat CT in 6 to 12 months.  Will plan for this in the future.

## 2023-02-18 NOTE — Patient Instructions (Addendum)
It was great to see you!  Try to limit any tylenol use for the next few weeks   Keep drinking plenty of fluids.   I have refilled your pain medication and the simethicone (gas x)  Make sure you call the stomach doctor for an appointment.   Use warm compresses the knot on your forearm  Let's follow-up in 3 months, sooner if you have concerns.  If a referral was placed today, you will be contacted for an appointment. Please note that routine referrals can sometimes take up to 3-4 weeks to process. Please call our office if you haven't heard anything after this time frame.  Take care,  Rodman Pickle, NP

## 2023-02-18 NOTE — Assessment & Plan Note (Signed)
She has stopped drinking alcohol since being hospitalized with pancreatitis.  Congratulated her on this!

## 2023-02-18 NOTE — Assessment & Plan Note (Signed)
She was recently hospitalized from 02/05/2023 through 02/13/2023 for acute on chronic alcoholic pancreatitis.  She has been drinking 6 drinks per day, however has stopped drinking since being in the hospital.  She was treated with IV fluids and pain medication.  She was discharged with oxycodone, however she is out of this medication.  She is still having some intermittent abdominal pain that radiates to her back.  Will check a CMP, CBC, lipase today.  Will also refill oxycodone 5 to 10 mg every 6 hours as needed for pain.  PDMP reviewed.  Encouraged her to schedule the follow-up appointment with GI.

## 2023-02-18 NOTE — Progress Notes (Signed)
Established Patient Office Visit  Subjective   Patient ID: Deborah Peterson, female    DOB: [birth year]  Age: 57 y.o. MRN: [id number]  Chief Complaint  Patient presents with   Hospitalization Follow-up    Acute pancreatitis,nausea, knot on left forearm from blown IV    HPI  Deborah Peterson is here to follow-up after hospitalization on 02/05/23-02/13/23 for acute on chronic alcoholic pancreatitis. She was treated with IV fluids and pain medications. She also had a CT scan which showed a 75mm left lower lobe nodule with recommendations to repeat imaging in 6-12 months.   She states that she is doing better than when she was in the hospital, although she did have a flareup of some pain last night.  The pain is in her epigastric area and radiates to her back.  She is also experiencing intermittent nausea.  She has been trying to drink more fluids.  She has completely stopped drinking alcohol.  She is also trying to cut back on her smoking.  She started wearing a nicotine patch and is only smoking 2 cigarettes/day.  She is hoping to stop completely.  She has noticed some weakness from being in the hospital.  She also notes that when she was in the hospital she had poor veins in her IVs Blowing.  She still has a knot on her left forearm and would like this looked at.    ROS See pertinent positives and negatives per HPI.    Objective:     BP 134/82 (BP Location: Right Arm)   Pulse 98   Temp 98.6 F (37 C)   Ht 5\' 1"  (1.549 m)   Wt 108 lb 3.2 oz (49.1 kg)   SpO2 98%   BMI 20.44 kg/m    Physical Exam Vitals and nursing note reviewed.  Constitutional:      General: She is not in acute distress.    Appearance: Normal appearance.  HENT:     Head: Normocephalic.  Eyes:     Conjunctiva/sclera: Conjunctivae normal.  Cardiovascular:     Rate and Rhythm: Normal rate and regular rhythm.     Pulses: Normal pulses.     Heart sounds: Normal heart sounds.  Pulmonary:     Effort:  Pulmonary effort is normal.     Breath sounds: Normal breath sounds.  Abdominal:     General: There is no distension.     Palpations: Abdomen is soft.     Tenderness: There is abdominal tenderness. There is no guarding or rebound.     Hernia: No hernia is present.  Musculoskeletal:     Cervical back: Normal range of motion.  Skin:    General: Skin is warm.  Neurological:     General: No focal deficit present.     Mental Status: She is alert and oriented to person, place, and time.  Psychiatric:        Mood and Affect: Mood normal.        Behavior: Behavior normal.        Thought Content: Thought content normal.        Judgment: Judgment normal.    The 10-year ASCVD risk score (Arnett DK, et al., 2019) is: 4.9%    Assessment & Plan:   Problem List Items Addressed This Visit       Respiratory   Pulmonary nodule    She had a CT of her abdomen and pelvis which showed a 7 mm left lower lobe nodule.  Since she is a smoker, recommend that we follow-up with a repeat CT in 6 to 12 months.  Will plan for this in the future.        Digestive   Alcohol-induced chronic pancreatitis - Primary    She was recently hospitalized from 02/05/2023 through 02/13/2023 for acute on chronic alcoholic pancreatitis.  She has been drinking 6 drinks per day, however has stopped drinking since being in the hospital.  She was treated with IV fluids and pain medication.  She was discharged with oxycodone, however she is out of this medication.  She is still having some intermittent abdominal pain that radiates to her back.  Will check a CMP, CBC, lipase today.  Will also refill oxycodone 5 to 10 mg every 6 hours as needed for pain.  PDMP reviewed.  Encouraged her to schedule the follow-up appointment with GI.      Relevant Medications   oxycodone (OXY-IR) 5 MG capsule   Other Relevant Orders   CBC   Comprehensive metabolic panel   Lipase   CBC with Differential/Platelet   Comprehensive metabolic panel    Lipase     Other   Tobacco use    She is cut back to 2 cigarettes/day and is using a nicotine patch.  Congratulated her on this.  Encouraged her to reach out if she needs a refill on her nicotine patch.      Alcohol abuse    She has stopped drinking alcohol since being hospitalized with pancreatitis.  Congratulated her on this!      Other Visit Diagnoses     IFG (impaired fasting glucose)       Check A1c today   Relevant Orders   Hemoglobin A1c   Hemoglobin A1C       Return in about 3 months (around 05/20/2023) for CPE.    Gerre Scull, NP

## 2023-02-18 NOTE — Assessment & Plan Note (Signed)
She is cut back to 2 cigarettes/day and is using a nicotine patch.  Congratulated her on this.  Encouraged her to reach out if she needs a refill on her nicotine patch.

## 2023-02-19 LAB — COMPREHENSIVE METABOLIC PANEL
AG Ratio: 1.1 (calc) (ref 1.0–2.5)
ALT: 12 U/L (ref 6–29)
AST: 23 U/L (ref 10–35)
Albumin: 3.4 g/dL — ABNORMAL LOW (ref 3.6–5.1)
Alkaline phosphatase (APISO): 101 U/L (ref 37–153)
BUN/Creatinine Ratio: 8 (calc) (ref 6–22)
BUN: 4 mg/dL — ABNORMAL LOW (ref 7–25)
CO2: 25 mmol/L (ref 20–32)
Calcium: 9.4 mg/dL (ref 8.6–10.4)
Chloride: 103 mmol/L (ref 98–110)
Creat: 0.5 mg/dL (ref 0.50–1.03)
Globulin: 3 g/dL (calc) (ref 1.9–3.7)
Glucose, Bld: 99 mg/dL (ref 65–99)
Potassium: 3.6 mmol/L (ref 3.5–5.3)
Sodium: 141 mmol/L (ref 135–146)
Total Bilirubin: 0.3 mg/dL (ref 0.2–1.2)
Total Protein: 6.4 g/dL (ref 6.1–8.1)

## 2023-02-19 LAB — CBC WITH DIFFERENTIAL/PLATELET
Absolute Monocytes: 610 cells/uL (ref 200–950)
Basophils Absolute: 27 cells/uL (ref 0–200)
Basophils Relative: 0.4 %
Eosinophils Absolute: 127 cells/uL (ref 15–500)
Eosinophils Relative: 1.9 %
HCT: 36.8 % (ref 35.0–45.0)
Hemoglobin: 12.8 g/dL (ref 11.7–15.5)
Lymphs Abs: 1588 cells/uL (ref 850–3900)
MCH: 35.2 pg — ABNORMAL HIGH (ref 27.0–33.0)
MCHC: 34.8 g/dL (ref 32.0–36.0)
MCV: 101.1 fL — ABNORMAL HIGH (ref 80.0–100.0)
MPV: 11.2 fL (ref 7.5–12.5)
Monocytes Relative: 9.1 %
Neutro Abs: 4348 cells/uL (ref 1500–7800)
Neutrophils Relative %: 64.9 %
Platelets: 493 10*3/uL — ABNORMAL HIGH (ref 140–400)
RBC: 3.64 10*6/uL — ABNORMAL LOW (ref 3.80–5.10)
RDW: 11.4 % (ref 11.0–15.0)
Total Lymphocyte: 23.7 %
WBC: 6.7 10*3/uL (ref 3.8–10.8)

## 2023-02-19 LAB — HEMOGLOBIN A1C
Hgb A1c MFr Bld: 5.2 % of total Hgb (ref <5.7)
Mean Plasma Glucose: 103 mg/dL
eAG (mmol/L): 5.7 mmol/L

## 2023-02-19 LAB — LIPASE: Lipase: 52 U/L (ref 7–60)

## 2023-02-22 ENCOUNTER — Other Ambulatory Visit: Payer: Self-pay | Admitting: Nurse Practitioner

## 2023-02-22 MED ORDER — OXYCODONE HCL 5 MG PO CAPS: 5.0000 mg | ORAL_CAPSULE | Freq: Four times a day (QID) | ORAL | 0 refills | Status: DC | PRN

## 2023-02-22 NOTE — Telephone Encounter (Signed)
Refill request for  Oxycodone 15 mg LR  02/18/23, #20, 0 rf LOV 02/18/23 FOV  none scheduled.   Patient comment: I am still in some pain, but it is getting better slowly.  Please review and advise.  Thanks. Dm/cma

## 2023-02-23 NOTE — Telephone Encounter (Signed)
LVM to return call.

## 2023-02-24 NOTE — Telephone Encounter (Signed)
LVM to return call.

## 2023-02-28 NOTE — Telephone Encounter (Signed)
LVM to return call.

## 2023-03-01 NOTE — Telephone Encounter (Signed)
Left voicemail regarding below message

## 2023-03-11 ENCOUNTER — Encounter: Payer: Self-pay | Admitting: Nurse Practitioner

## 2023-03-16 ENCOUNTER — Other Ambulatory Visit: Payer: Self-pay | Admitting: Nurse Practitioner

## 2023-03-17 NOTE — Telephone Encounter (Signed)
Requesting: pantoprazole 40 mg tablet,delayed release  Last Visit: 02/18/2023 Next Visit: Visit date not found Last Refill: 01/26/2022  Please Advise

## 2023-04-09 DIAGNOSIS — Z419 Encounter for procedure for purposes other than remedying health state, unspecified: Secondary | ICD-10-CM | POA: Diagnosis not present

## 2023-05-09 DIAGNOSIS — Z419 Encounter for procedure for purposes other than remedying health state, unspecified: Secondary | ICD-10-CM | POA: Diagnosis not present

## 2023-06-09 DIAGNOSIS — Z419 Encounter for procedure for purposes other than remedying health state, unspecified: Secondary | ICD-10-CM | POA: Diagnosis not present

## 2023-06-28 ENCOUNTER — Encounter: Payer: Self-pay | Admitting: Nurse Practitioner

## 2023-06-28 ENCOUNTER — Ambulatory Visit: Payer: Medicaid Other | Admitting: Nurse Practitioner

## 2023-06-28 VITALS — BP 132/60 | HR 100 | Temp 97.0°F | Ht 61.0 in | Wt 113.2 lb

## 2023-06-28 DIAGNOSIS — R0602 Shortness of breath: Secondary | ICD-10-CM | POA: Diagnosis not present

## 2023-06-28 DIAGNOSIS — Z720 Tobacco use: Secondary | ICD-10-CM

## 2023-06-28 DIAGNOSIS — Z1231 Encounter for screening mammogram for malignant neoplasm of breast: Secondary | ICD-10-CM

## 2023-06-28 MED ORDER — ALBUTEROL SULFATE HFA 108 (90 BASE) MCG/ACT IN AERS: 2.0000 | INHALATION_SPRAY | Freq: Four times a day (QID) | RESPIRATORY_TRACT | 0 refills | Status: AC | PRN

## 2023-06-28 MED ORDER — MONTELUKAST SODIUM 10 MG PO TABS
10.0000 mg | ORAL_TABLET | Freq: Every day | ORAL | 3 refills | Status: DC
Start: 2023-06-28 — End: 2023-12-16

## 2023-06-28 MED ORDER — PREDNISONE 20 MG PO TABS
40.0000 mg | ORAL_TABLET | Freq: Every day | ORAL | 0 refills | Status: DC
Start: 2023-06-28 — End: 2023-10-19

## 2023-06-28 NOTE — Assessment & Plan Note (Signed)
She is currently smoking 3 cigarettes/day.  She is going to restart using her nicotine patch to hopefully cut down completely.

## 2023-06-28 NOTE — Assessment & Plan Note (Signed)
She has been experiencing shortness of breath for the past [redacted] weeks along with a cough.  She denies initial URI.  Lung sounds clear on exam today.  She did go to the ER and was given doxycycline and Tessalon Perles, however this did not help.  Chest x-ray from 02/11/2023 does show possible COPD.  Spirometry done today was normal, see scanned document under media tab.  She is hesitant to start an inhaler, because she states she loses them frequently.  Will have her start prednisone 40 mg daily for 5 days, montelukast 10 mg daily at bedtime, and an albuterol inhaler every 6 hours as needed for shortness of breath or wheezing.  Discussed smoking cessation. Follow-up in 4 weeks.

## 2023-06-28 NOTE — Patient Instructions (Addendum)
It was great to see you!  Start prednisone 2 tablets daily with food in the morning.   Start montelukast (allergy pill) daily at bedtime.   Start the albuterol inhaler as needed for shortness of breath.   You are scheduled for a mammogram at our office on 07/25/23 at 10:40am.   Let's follow-up in 4 weeks, sooner if you have concerns.  If a referral was placed today, you will be contacted for an appointment. Please note that routine referrals can sometimes take up to 3-4 weeks to process. Please call our office if you haven't heard anything after this time frame.  Take care,  Rodman Pickle, NP

## 2023-06-28 NOTE — Progress Notes (Signed)
Acute Office Visit  Subjective:     Patient ID: Deborah Peterson, female    DOB: 10/16/1966, 57 y.o.   MRN: [id number]  Chief Complaint  Patient presents with   Cough    With some SOB that started 6 weeks ago, shingles vaccine    HPI Patient is in today for cough with shortness of breath that started 6 weeks ago.   She states that she had a window break at her house and some water got in. She is waiting for her landlord to do mildew testing. She denies initial URI at beginning of symptoms. She denies fevers and chest pain. She endorses a little wheezing. She is concerned for COPD. She is down to 3 cigarettes daily. She has also started snoring at night.   ROS See pertinent positives and negatives per HPI.     Objective:    BP 132/60 (BP Location: Left Arm)   Pulse 100   Temp (!) 97 F (36.1 C)   Ht 5\' 1"  (1.549 m)   Wt 113 lb 3.2 oz (51.3 kg)   SpO2 99%   BMI 21.39 kg/m    Physical Exam Vitals and nursing note reviewed.  Constitutional:      General: She is not in acute distress.    Appearance: Normal appearance.  HENT:     Head: Normocephalic.  Eyes:     Conjunctiva/sclera: Conjunctivae normal.  Cardiovascular:     Rate and Rhythm: Normal rate and regular rhythm.     Pulses: Normal pulses.     Heart sounds: Normal heart sounds.  Pulmonary:     Effort: Pulmonary effort is normal.     Breath sounds: Normal breath sounds.  Musculoskeletal:     Cervical back: Normal range of motion.  Skin:    General: Skin is warm.  Neurological:     General: No focal deficit present.     Mental Status: She is alert and oriented to person, place, and time.  Psychiatric:        Mood and Affect: Mood normal.        Behavior: Behavior normal.        Thought Content: Thought content normal.        Judgment: Judgment normal.       Assessment & Plan:   Problem List Items Addressed This Visit       Other   Tobacco use    She is currently smoking 3 cigarettes/day.   She is going to restart using her nicotine patch to hopefully cut down completely.      Shortness of breath - Primary    She has been experiencing shortness of breath for the past [redacted] weeks along with a cough.  She denies initial URI.  Lung sounds clear on exam today.  She did go to the ER and was given doxycycline and Tessalon Perles, however this did not help.  Chest x-ray from 02/11/2023 does show possible COPD.  Spirometry done today was normal, see scanned document under media tab.  She is hesitant to start an inhaler, because she states she loses them frequently.  Will have her start prednisone 40 mg daily for 5 days, montelukast 10 mg daily at bedtime, and an albuterol inhaler every 6 hours as needed for shortness of breath or wheezing.  Discussed smoking cessation. Follow-up in 4 weeks.       Other Visit Diagnoses     Encounter for screening mammogram for malignant neoplasm of breast  Mammogram ordered today for mobile bus on 07/25/23 at 10:40am   Relevant Orders   MM 3D SCREENING MAMMOGRAM BILATERAL BREAST       Meds ordered this encounter  Medications   predniSONE (DELTASONE) 20 MG tablet    Sig: Take 2 tablets (40 mg total) by mouth daily with breakfast.    Dispense:  10 tablet    Refill:  0   montelukast (SINGULAIR) 10 MG tablet    Sig: Take 1 tablet (10 mg total) by mouth at bedtime.    Dispense:  30 tablet    Refill:  3   albuterol (VENTOLIN HFA) 108 (90 Base) MCG/ACT inhaler    Sig: Inhale 2 puffs into the lungs every 6 (six) hours as needed for wheezing or shortness of breath.    Dispense:  8 g    Refill:  0    Return in about 4 weeks (around 07/26/2023) for shortness of breath.  Gerre Scull, NP

## 2023-06-29 MED ORDER — NYSTATIN 100000 UNIT/ML MT SUSP
5.0000 mL | Freq: Four times a day (QID) | OROMUCOSAL | 0 refills | Status: DC
Start: 2023-06-29 — End: 2023-10-19

## 2023-07-10 DIAGNOSIS — Z419 Encounter for procedure for purposes other than remedying health state, unspecified: Secondary | ICD-10-CM | POA: Diagnosis not present

## 2023-07-18 ENCOUNTER — Encounter: Payer: Self-pay | Admitting: Nurse Practitioner

## 2023-07-18 MED ORDER — FLUCONAZOLE 150 MG PO TABS: ORAL_TABLET | ORAL | 0 refills | Status: DC

## 2023-07-25 ENCOUNTER — Ambulatory Visit
Admission: RE | Admit: 2023-07-25 | Discharge: 2023-07-25 | Disposition: A | Discharge status: Home / Self Care | Payer: Medicaid Other | Source: Ambulatory Visit | Attending: Nurse Practitioner | Admitting: Nurse Practitioner

## 2023-07-25 DIAGNOSIS — Z1231 Encounter for screening mammogram for malignant neoplasm of breast: Secondary | ICD-10-CM

## 2023-08-01 ENCOUNTER — Ambulatory Visit: Payer: Medicaid Other | Admitting: Nurse Practitioner

## 2023-08-09 DIAGNOSIS — Z419 Encounter for procedure for purposes other than remedying health state, unspecified: Secondary | ICD-10-CM | POA: Diagnosis not present

## 2023-08-12 ENCOUNTER — Other Ambulatory Visit: Payer: Self-pay | Admitting: Nurse Practitioner

## 2023-08-12 DIAGNOSIS — Z1211 Encounter for screening for malignant neoplasm of colon: Secondary | ICD-10-CM

## 2023-08-12 DIAGNOSIS — Z1212 Encounter for screening for malignant neoplasm of rectum: Secondary | ICD-10-CM

## 2023-08-16 ENCOUNTER — Encounter: Payer: Self-pay | Admitting: Nurse Practitioner

## 2023-08-16 NOTE — Telephone Encounter (Signed)
I called and spoke with patient after consulting with Dr. Janee Morn that she needs to go to the ER to be seen for this.

## 2023-09-09 DIAGNOSIS — Z419 Encounter for procedure for purposes other than remedying health state, unspecified: Secondary | ICD-10-CM | POA: Diagnosis not present

## 2023-09-11 IMAGING — DX DG HIP (WITH OR WITHOUT PELVIS) 2-3V*L*
2 series · 2 of 2 positions shown · non-contrast
Comparison: None Available.

CLINICAL DATA: Chronic left hip pain

EXAM:
DG HIP (WITH OR WITHOUT PELVIS) 2-3V LEFT

[pelvis ap]
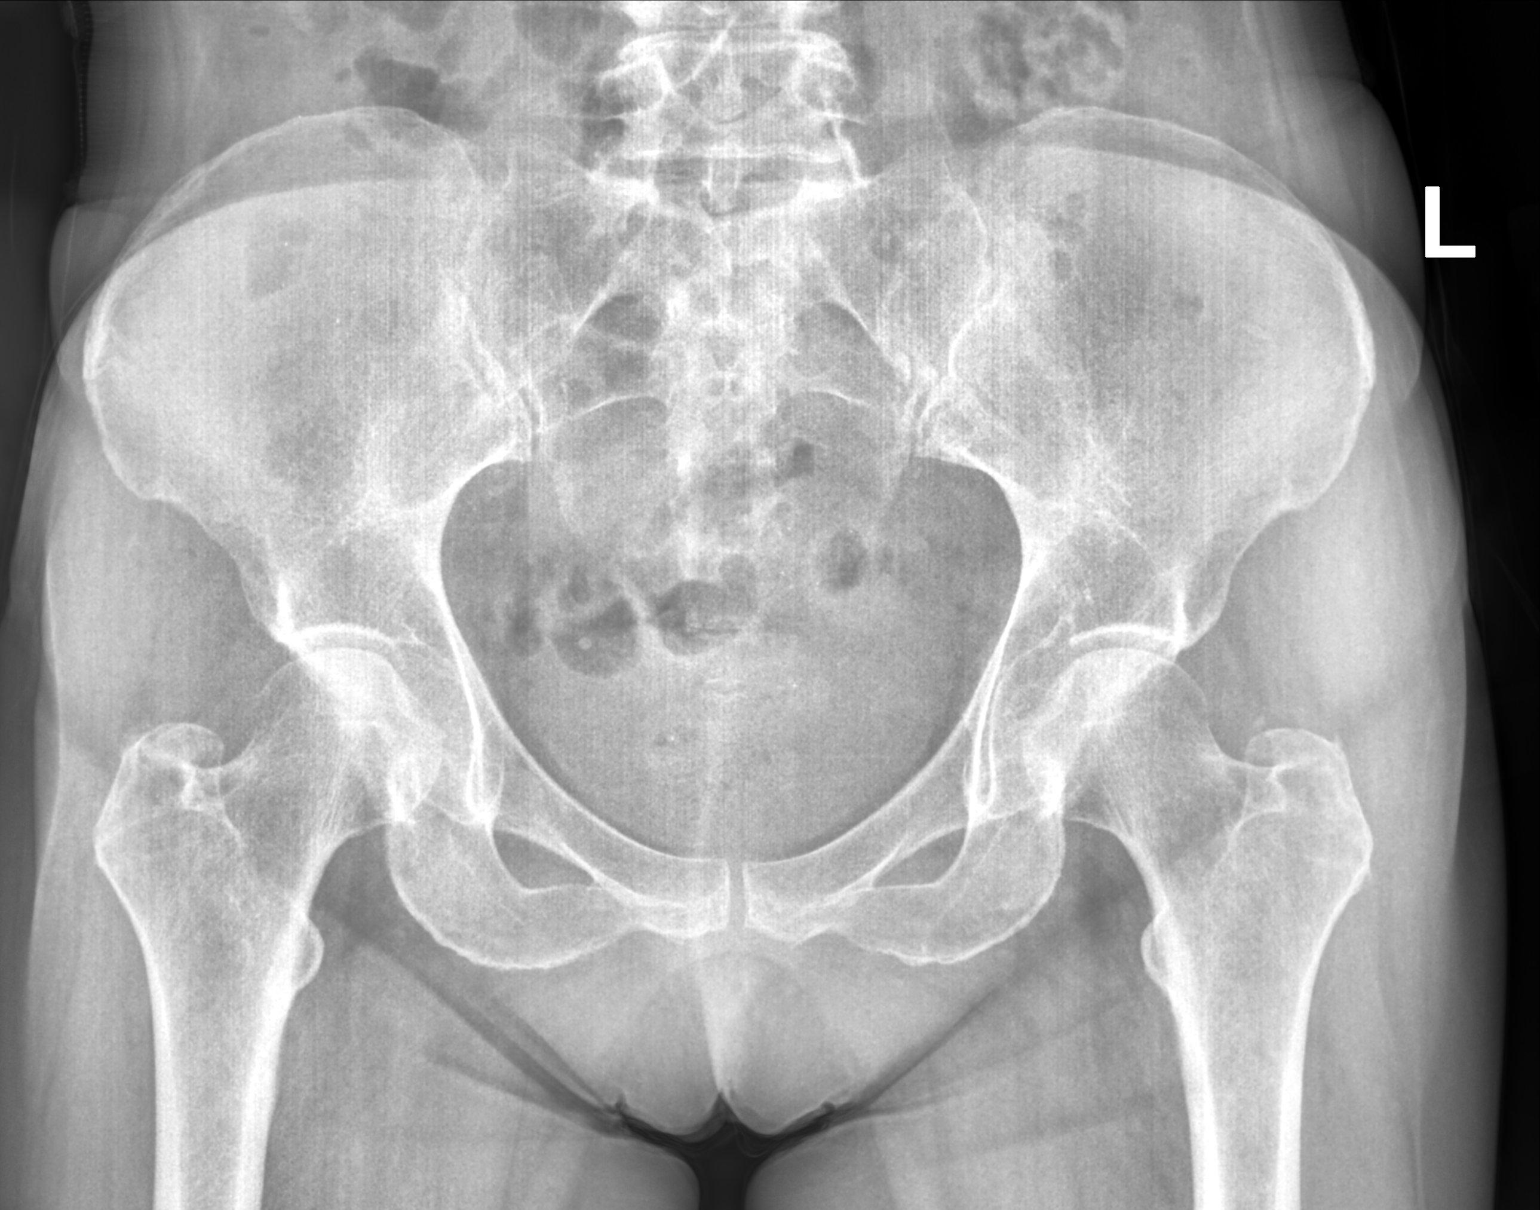

[hip frog leg lat]
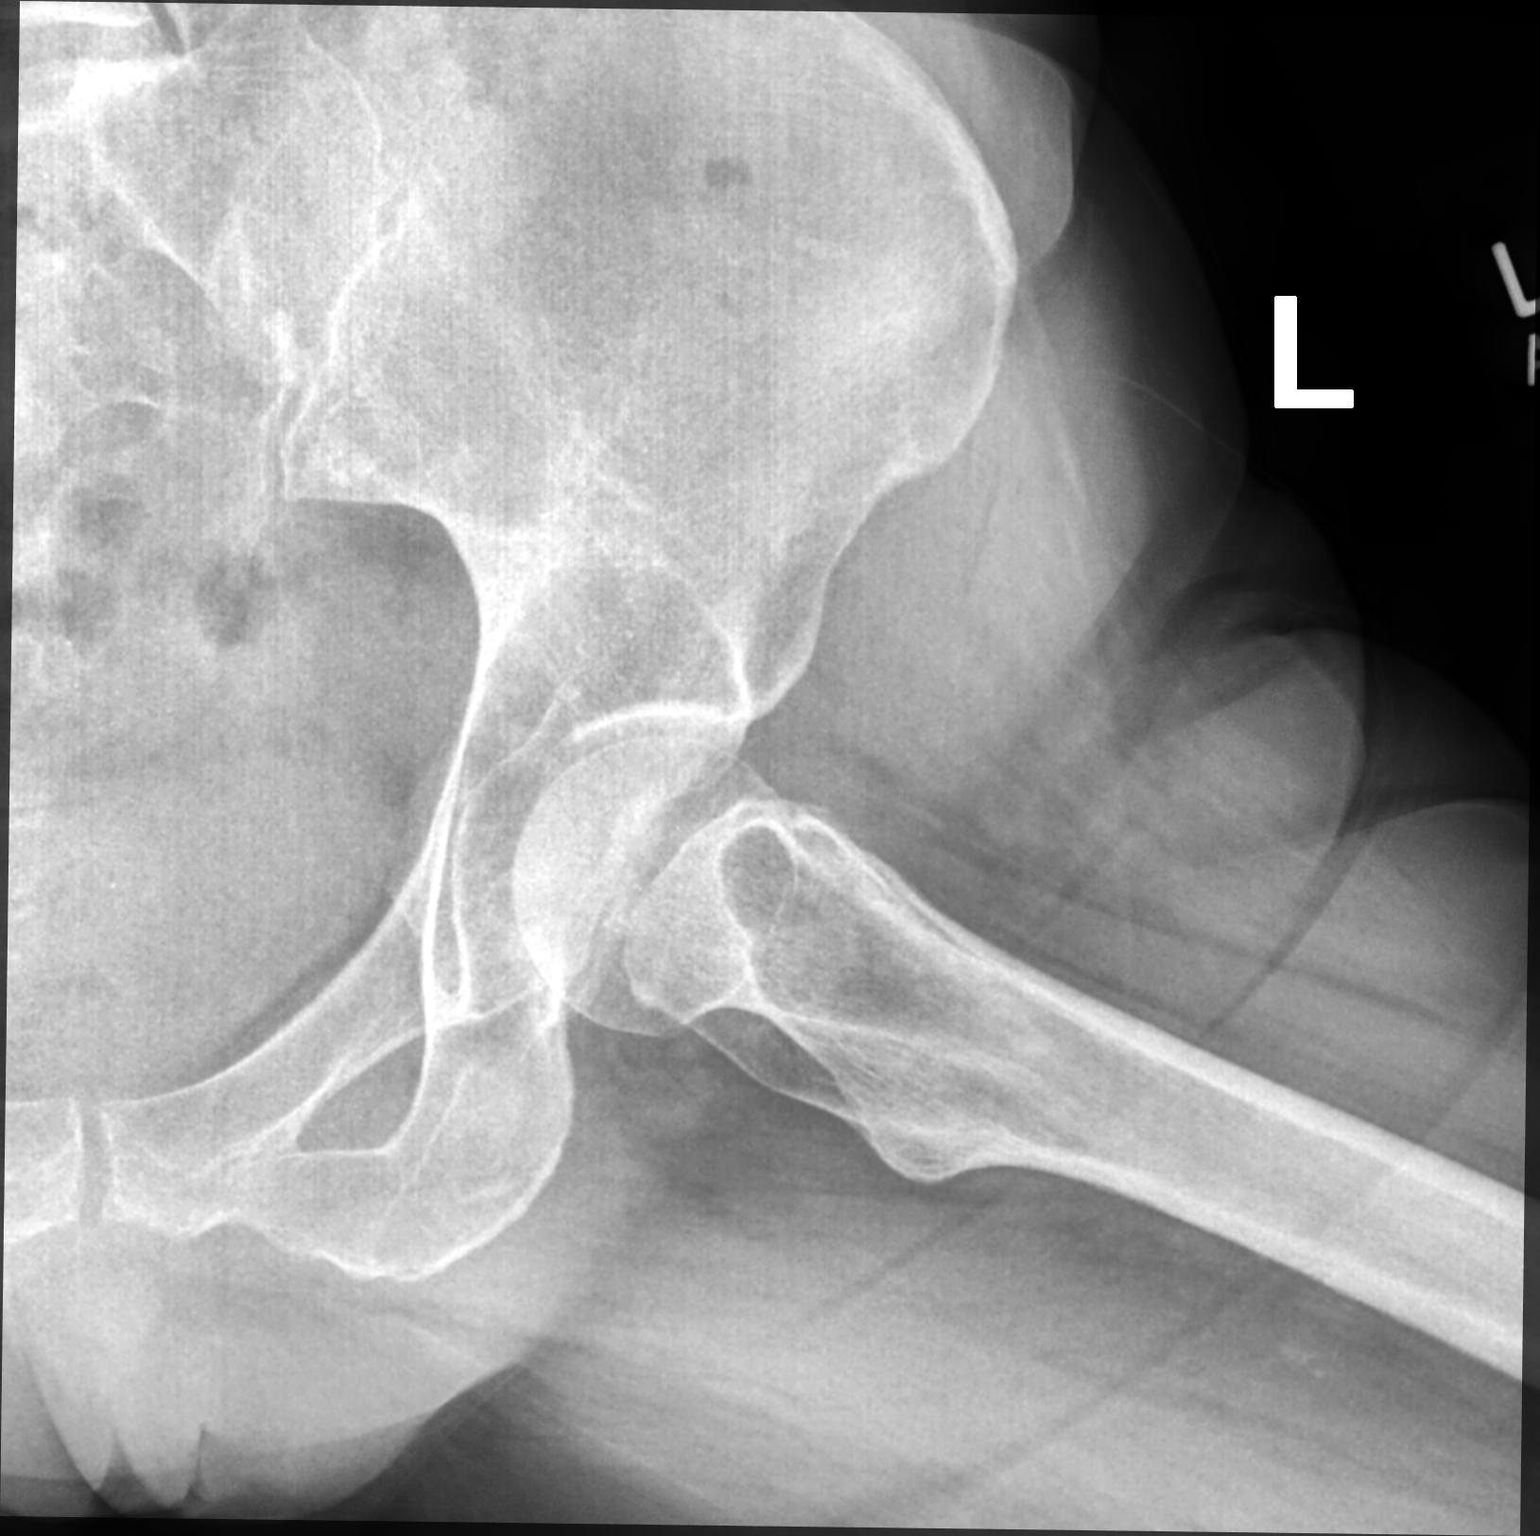

[2 of 2 positions shown; findings below may reference images not displayed]

FINDINGS: There is no evidence of hip fracture or dislocation. There is no
evidence of arthropathy or other focal bone abnormality.
IMPRESSION: Negative.

## 2023-10-09 DIAGNOSIS — Z419 Encounter for procedure for purposes other than remedying health state, unspecified: Secondary | ICD-10-CM | POA: Diagnosis not present

## 2023-10-18 ENCOUNTER — Telehealth: Payer: Self-pay | Admitting: Nurse Practitioner

## 2023-10-18 NOTE — Telephone Encounter (Signed)
Caller Name: Deborah Peterson Ph #: [phone] Chief Complaint: extreme leg weakness pain and dizziness.  This call was transferred to Nurse Triage/Access Nurse. This is for documentation purposes. No follow up required at this time.

## 2023-10-19 ENCOUNTER — Ambulatory Visit: Payer: Medicaid Other | Admitting: Nurse Practitioner

## 2023-10-19 ENCOUNTER — Encounter: Payer: Self-pay | Admitting: Nurse Practitioner

## 2023-10-19 VITALS — BP 128/56 | HR 105 | Temp 97.4°F | Ht 61.0 in | Wt 115.0 lb

## 2023-10-19 DIAGNOSIS — F5101 Primary insomnia: Secondary | ICD-10-CM

## 2023-10-19 DIAGNOSIS — G250 Essential tremor: Secondary | ICD-10-CM | POA: Diagnosis not present

## 2023-10-19 DIAGNOSIS — G8929 Other chronic pain: Secondary | ICD-10-CM

## 2023-10-19 DIAGNOSIS — E538 Deficiency of other specified B group vitamins: Secondary | ICD-10-CM

## 2023-10-19 DIAGNOSIS — E7800 Pure hypercholesterolemia, unspecified: Secondary | ICD-10-CM

## 2023-10-19 DIAGNOSIS — M5442 Lumbago with sciatica, left side: Secondary | ICD-10-CM

## 2023-10-19 DIAGNOSIS — R42 Dizziness and giddiness: Secondary | ICD-10-CM | POA: Diagnosis not present

## 2023-10-19 LAB — LIPID PANEL
Cholesterol: 230 mg/dL — ABNORMAL HIGH (ref 0–200)
HDL: 59.2 mg/dL (ref >39.00)
LDL Cholesterol: 141 mg/dL — ABNORMAL HIGH (ref 0–99)
NonHDL: 170.42
Total CHOL/HDL Ratio: 4
Triglycerides: 146 mg/dL (ref 0.0–149.0)
VLDL: 29.2 mg/dL (ref 0.0–40.0)

## 2023-10-19 LAB — COMPREHENSIVE METABOLIC PANEL
ALT: 9 U/L (ref 0–35)
AST: 19 U/L (ref 0–37)
Albumin: 3.8 g/dL (ref 3.5–5.2)
Alkaline Phosphatase: 121 U/L — ABNORMAL HIGH (ref 39–117)
BUN: 5 mg/dL — ABNORMAL LOW (ref 6–23)
CO2: 29 meq/L (ref 19–32)
Calcium: 9.2 mg/dL (ref 8.4–10.5)
Chloride: 101 meq/L (ref 96–112)
Creatinine, Ser: 0.52 mg/dL (ref 0.40–1.20)
GFR: 102.73 mL/min (ref >60.00)
Glucose, Bld: 72 mg/dL (ref 70–99)
Potassium: 3.7 meq/L (ref 3.5–5.1)
Sodium: 138 meq/L (ref 135–145)
Total Bilirubin: 0.3 mg/dL (ref 0.2–1.2)
Total Protein: 6.9 g/dL (ref 6.0–8.3)

## 2023-10-19 LAB — CBC WITH DIFFERENTIAL/PLATELET
Basophils Absolute: 0 10*3/uL (ref 0.0–0.1)
Basophils Relative: 0.8 % (ref 0.0–3.0)
Eosinophils Absolute: 0.1 10*3/uL (ref 0.0–0.7)
Eosinophils Relative: 1.8 % (ref 0.0–5.0)
HCT: 41.3 % (ref 36.0–46.0)
Hemoglobin: 13.8 g/dL (ref 12.0–15.0)
Lymphocytes Relative: 28.8 % (ref 12.0–46.0)
Lymphs Abs: 1.4 10*3/uL (ref 0.7–4.0)
MCHC: 33.5 g/dL (ref 30.0–36.0)
MCV: 104.4 fL — ABNORMAL HIGH (ref 78.0–100.0)
Monocytes Absolute: 0.4 10*3/uL (ref 0.1–1.0)
Monocytes Relative: 8.1 % (ref 3.0–12.0)
Neutro Abs: 3 10*3/uL (ref 1.4–7.7)
Neutrophils Relative %: 60.5 % (ref 43.0–77.0)
Platelets: 449 10*3/uL — ABNORMAL HIGH (ref 150.0–400.0)
RBC: 3.96 Mil/uL (ref 3.87–5.11)
RDW: 12.9 % (ref 11.5–15.5)
WBC: 5 10*3/uL (ref 4.0–10.5)

## 2023-10-19 LAB — TSH: TSH: 1.36 u[IU]/mL (ref 0.35–5.50)

## 2023-10-19 LAB — VITAMIN B12: Vitamin B-12: 238 pg/mL (ref 211–911)

## 2023-10-19 MED ORDER — GABAPENTIN 300 MG PO CAPS: 300.0000 mg | ORAL_CAPSULE | Freq: Two times a day (BID) | ORAL | 1 refills | Status: DC

## 2023-10-19 NOTE — Assessment & Plan Note (Signed)
 Check CMP, CBC, lipid panel today and treat based on results.

## 2023-10-19 NOTE — Progress Notes (Signed)
Acute Office Visit  Subjective:     Patient ID: Deborah Peterson, female    DOB: 1966/09/09, 57 y.o.   MRN: [id number]  Chief Complaint  Patient presents with   Bilateral Leg weekness, pain, dizziness    Going for 2 months, tremors in left arm, insomnia    HPI Patient is in today for leg weakness and pain.   Discussed the use of AI scribe software for clinical note transcription with the patient, who gave verbal consent to proceed.  History of Present Illness   The patient, with a history of pancreatitis, presents with worsening hip pain and weakness. She describes the pain as originating in the hip and radiating to the lower back and calves, particularly when standing from a seated position or bending over. The pain is constant and has been progressively worsening, affecting her ability to work and perform daily activities. She has been managing the pain with over-the-counter Tylenol and naproxen, with limited relief. She denies fevers and incontinence.   In addition to the hip pain, the patient has been experiencing tremors in both arms, more noticeable in certain positions such as when driving. The tremors started a few weeks ago and have been gradually increasing in frequency. She also reports occasional dizziness, with episodes of feeling off-balance, as if she had been drinking. These episodes have occurred six to seven times in the past seven to eight months and are not associated with any specific triggers or changes in position.  The patient has also been struggling with sleep disturbances since quitting alcohol. She describes difficulty falling asleep, often taking up to three hours to fall asleep. She has been managing the insomnia with over-the-counter Benadryl, which helps but leaves her feeling groggy in the morning.      ROS See pertinent positives and negatives per HPI.     Objective:    BP (!) 128/56 (BP Location: Left Arm)   Pulse (!) 105   Temp (!) 97.4 F  (36.3 C)   Ht 5\' 1"  (1.549 m)   Wt 115 lb (52.2 kg)   SpO2 100%   BMI 21.73 kg/m    Physical Exam Vitals and nursing note reviewed.  Constitutional:      General: She is not in acute distress.    Appearance: Normal appearance.  HENT:     Head: Normocephalic.  Eyes:     Conjunctiva/sclera: Conjunctivae normal.  Cardiovascular:     Rate and Rhythm: Normal rate and regular rhythm.     Pulses: Normal pulses.     Heart sounds: Normal heart sounds.  Pulmonary:     Effort: Pulmonary effort is normal.     Breath sounds: Normal breath sounds.  Musculoskeletal:        General: No swelling or tenderness. Normal range of motion.     Cervical back: Normal range of motion.     Comments: Straight leg raise positive on left, negative on right.   Skin:    General: Skin is warm.  Neurological:     General: No focal deficit present.     Mental Status: She is alert and oriented to person, place, and time.     Cranial Nerves: No cranial nerve deficit.     Motor: No weakness.     Gait: Gait normal.  Psychiatric:        Mood and Affect: Mood normal.        Behavior: Behavior normal.        Thought Content:  Thought content normal.        Judgment: Judgment normal.      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 She also reports pain and weakness in the legs, which complicates standing up from a sitting position, and numbness in the right foot during prolonged standing. No red flags on exam. We will order a lumbar spine x-ray and start Gabapentin 300mg , one capsule at bedtime for three days, then increase to twice daily if needed. Additionally, we will provide stretches for daily exercise.      Relevant Medications   gabapentin (NEURONTIN) 300 MG  capsule   Other Relevant Orders   DG Lumbar Spine Complete   Essential tremor    She experiences tremors in both arms, notably when in certain positions like driving, without associated pain. This may represent an unmasking of essential tremor following the cessation of alcohol consumption. We will monitor the response to Gabapentin, which may also help with the tremors.        Other   Dizziness    Check CMP, CBC, TSH today. Encourage fluids.       Relevant Orders   CBC with Differential/Platelet   Comprehensive metabolic panel   TSH   Primary insomnia    She has difficulty falling asleep, often taking up to three hours, and occasionally uses Benadryl for sleep. This issue has arisen since she stopped consuming alcohol. We will start Gabapentin for her back and hip pain, which may aid in sleep as well. Discussed sleep hygiene.       Vitamin B12 deficiency    Check vitamin B12 today and treat based on results.       Relevant Orders   Vitamin B12   Pure hypercholesterolemia    Check CMP, CBC, lipid panel today and treat based on results      Relevant Orders   CBC with Differential/Platelet   Comprehensive metabolic panel   Lipid panel    Meds ordered this encounter  Medications   gabapentin (NEURONTIN) 300 MG capsule    Sig: Take 1 capsule (300 mg total) by mouth 2 (two) times daily. Start 1 capsule daily at bedtime for 3 days,  then twice a day    Dispense:  60 capsule    Refill:  1    Return in about 4 weeks (around 11/16/2023) for back pain.  Gerre Scull, NP

## 2023-10-19 NOTE — Patient Instructions (Signed)
It was great to see you!  Start gabapentin 1 capsule daily at bedtime for 3 days, then increase to twice a day if needed  We are going to get an xray of your back   We are checking your labs today and will let you know the results via mychart/phone.   Let's follow-up in 4 weeks, sooner if you have concerns.  If a referral was placed today, you will be contacted for an appointment. Please note that routine referrals can sometimes take up to 3-4 weeks to process. Please call our office if you haven't heard anything after this time frame.  Take care,  Rodman Pickle, NP

## 2023-10-19 NOTE — Telephone Encounter (Signed)
Will wait for triage note.

## 2023-10-19 NOTE — Assessment & Plan Note (Signed)
She has difficulty falling asleep, often taking up to three hours, and occasionally uses Benadryl for sleep. This issue has arisen since she stopped consuming alcohol. We will start Gabapentin for her back and hip pain, which may aid in sleep as well. Discussed sleep hygiene.

## 2023-10-19 NOTE — Assessment & Plan Note (Signed)
She exhibits pain in the lower back and hip region, extending to both calves, particularly when bending over and standing up. She also reports pain and weakness in the legs, which complicates standing up from a sitting position, and numbness in the right foot during prolonged standing. No red flags on exam. We will order a lumbar spine x-ray and start Gabapentin 300mg , one capsule at bedtime for three days, then increase to twice daily if needed. Additionally, we will provide stretches for daily exercise.

## 2023-10-19 NOTE — Assessment & Plan Note (Signed)
 Check CMP, CBC, TSH today. Encourage fluids.

## 2023-10-19 NOTE — Assessment & Plan Note (Signed)
She experiences tremors in both arms, notably when in certain positions like driving, without associated pain. This may represent an unmasking of essential tremor following the cessation of alcohol consumption. We will monitor the response to Gabapentin, which may also help with the tremors.

## 2023-10-19 NOTE — Assessment & Plan Note (Signed)
 Check vitamin B12 today and treat based on results.

## 2023-11-09 DIAGNOSIS — Z419 Encounter for procedure for purposes other than remedying health state, unspecified: Secondary | ICD-10-CM | POA: Diagnosis not present

## 2023-11-10 ENCOUNTER — Telehealth: Payer: Medicaid Other | Admitting: Physician Assistant

## 2023-11-10 DIAGNOSIS — M545 Low back pain, unspecified: Secondary | ICD-10-CM

## 2023-11-10 MED ORDER — METHYLPREDNISOLONE 4 MG PO TBPK: ORAL_TABLET | ORAL | 0 refills | Status: DC

## 2023-11-10 MED ORDER — CYCLOBENZAPRINE HCL 10 MG PO TABS: 5.0000 mg | ORAL_TABLET | Freq: Three times a day (TID) | ORAL | 0 refills | Status: DC | PRN

## 2023-11-10 NOTE — Progress Notes (Signed)
 E-Visit for Back Pain   We are sorry that you are not feeling well.  Here is how we plan to help!  Based on what you have shared with me it looks like you mostly have acute back pain.  Acute back pain is defined as musculoskeletal pain that can resolve in 1-3 weeks with conservative treatment.  I have prescribed Medrol dose pack, a steroid anti-inflammatory, as well as Flexeril 10 mg every eight hours as needed which is a muscle relaxer  Some patients experience stomach irritation or in increased heartburn with anti-inflammatory drugs.  Please keep in mind that muscle relaxer's can cause fatigue and should not be taken while at work or driving.  Back pain is very common.  The pain often gets better over time.  The cause of back pain is usually not dangerous.  Most people can learn to manage their back pain on their own.  Home Care Stay active.  Start with short walks on flat ground if you can.  Try to walk farther each day. Do not sit, drive or stand in one place for more than 30 minutes.  Do not stay in bed. Do not avoid exercise or work.  Activity can help your back heal faster. Be careful when you bend or lift an object.  Bend at your knees, keep the object close to you, and do not twist. Sleep on a firm mattress.  Lie on your side, and bend your knees.  If you lie on your back, put a pillow under your knees. Only take medicines as told by your doctor. Put ice on the injured area. Put ice in a plastic bag Place a towel between your skin and the bag Leave the ice on for 15-20 minutes, 3-4 times a day for the first 2-3 days. 210 After that, you can switch between ice and heat packs. Ask your doctor about back exercises or massage. Avoid feeling anxious or stressed.  Find good ways to deal with stress, such as exercise.  Get Help Right Way If: Your pain does not go away with rest or medicine. Your pain does not go away in 1 week. You have new problems. You do not feel well. The pain  spreads into your legs. You cannot control when you poop (bowel movement) or pee (urinate) You feel sick to your stomach (nauseous) or throw up (vomit) You have belly (abdominal) pain. You feel like you may pass out (faint). If you develop a fever.  Make Sure you: Understand these instructions. Will watch your condition Will get help right away if you are not doing well or get worse.  Your e-visit answers were reviewed by a board certified advanced clinical practitioner to complete your personal care plan.  Depending on the condition, your plan could have included both over the counter or prescription medications.  If there is a problem please reply  once you have received a response from your provider.  Your safety is important to [place].  If you have drug allergies check your prescription carefully.    You can use MyChart to ask questions about today's visit, request a non-urgent call back, or ask for a work or school excuse for 24 hours related to this e-Visit. If it has been greater than 24 hours you will need to follow up with your provider, or enter a new e-Visit to address those concerns.  You will get an e-mail in the next two days asking about your experience.  I hope that your  e-visit has been valuable and will speed your recovery. Thank you for using e-visits.  I have spent 5 minutes in review of e-visit questionnaire, review and updating patient chart, medical decision making and response to patient.   Margaretann Loveless, PA-C

## 2023-11-11 ENCOUNTER — Ambulatory Visit (INDEPENDENT_AMBULATORY_CARE_PROVIDER_SITE_OTHER): Payer: Medicaid Other

## 2023-11-11 DIAGNOSIS — I700 Atherosclerosis of aorta: Secondary | ICD-10-CM | POA: Diagnosis not present

## 2023-11-11 DIAGNOSIS — G8929 Other chronic pain: Secondary | ICD-10-CM | POA: Diagnosis not present

## 2023-11-11 DIAGNOSIS — M5442 Lumbago with sciatica, left side: Secondary | ICD-10-CM

## 2023-11-11 DIAGNOSIS — M5136 Other intervertebral disc degeneration, lumbar region with discogenic back pain only: Secondary | ICD-10-CM | POA: Diagnosis not present

## 2023-11-16 ENCOUNTER — Other Ambulatory Visit: Payer: Self-pay | Admitting: Nurse Practitioner

## 2023-11-16 NOTE — Telephone Encounter (Signed)
 Requesting: gabapentin 300 mg capsule  Last Visit: 10/19/2023 Next Visit: 11/17/2023 Last Refill: 10/19/2023  Please Advise

## 2023-11-17 ENCOUNTER — Ambulatory Visit (INDEPENDENT_AMBULATORY_CARE_PROVIDER_SITE_OTHER): Payer: Medicaid Other | Admitting: Nurse Practitioner

## 2023-11-17 ENCOUNTER — Encounter: Payer: Self-pay | Admitting: Nurse Practitioner

## 2023-11-17 VITALS — BP 102/60 | HR 78 | Temp 98.3°F | Ht 61.0 in | Wt 113.6 lb

## 2023-11-17 DIAGNOSIS — M5442 Lumbago with sciatica, left side: Secondary | ICD-10-CM

## 2023-11-17 DIAGNOSIS — Z1211 Encounter for screening for malignant neoplasm of colon: Secondary | ICD-10-CM

## 2023-11-17 DIAGNOSIS — G8929 Other chronic pain: Secondary | ICD-10-CM

## 2023-11-17 MED ORDER — PREGABALIN 75 MG PO CAPS: 75.0000 mg | ORAL_CAPSULE | Freq: Every day | ORAL | 1 refills | Status: DC

## 2023-11-17 NOTE — Assessment & Plan Note (Signed)
 Chronic, improving. Her lower back pain has improved with prednisone , but gabapentin  was discontinued due to side effects, including feeling drunk. Multilevel arthritis was noted on x-ray. We will start Lyrica  75mg  once daily for pain management, continue daily back stretches, and use Tylenol  as needed for pain. PDMP reviewed. Follow-up in 2 months.

## 2023-11-17 NOTE — Progress Notes (Signed)
 Established Patient Office Visit  Subjective   Patient ID: Deborah Peterson, female    DOB: [birth year]  Age: 58 y.o. MRN: [id number]  Chief Complaint  Patient presents with   Back Pain    Follow up    HPI  Discussed the use of AI scribe software for clinical note transcription with the patient, who gave verbal consent to proceed.  History of Present Illness   The patient, with a history of back pain, reports improvement in her symptoms after a recent course of prednisone . She describes the pain as having been significantly reduced by day three of the steroid course. However, she also reports experiencing a sensation of being 'drunk' after starting gabapentin  on the sixth day, which was concerning enough to make her stop the medication. The patient also mentions having a new puppy and needing to navigate stairs, which could potentially exacerbate her back pain. She has also been taking tylenol  as needed as she can't take ibuprofen or aleve.        ROS See pertinent positives and negatives per HPI.    Objective:     BP 102/60 (BP Location: Left Arm, Patient Position: Sitting, Cuff Size: Small)   Pulse 78   Temp 98.3 F (36.8 C) (Oral)   Ht 5' 1 (1.549 m)   Wt 113 lb 9.6 oz (51.5 kg)   SpO2 98%   BMI 21.46 kg/m    Physical Exam Vitals and nursing note reviewed.  Constitutional:      General: She is not in acute distress.    Appearance: Normal appearance.  HENT:     Head: Normocephalic.  Eyes:     Conjunctiva/sclera: Conjunctivae normal.  Cardiovascular:     Rate and Rhythm: Normal rate and regular rhythm.     Pulses: Normal pulses.     Heart sounds: Normal heart sounds.  Pulmonary:     Effort: Pulmonary effort is normal.     Breath sounds: Normal breath sounds.  Musculoskeletal:        General: No swelling or tenderness. Normal range of motion.     Cervical back: Normal range of motion.  Skin:    General: Skin is warm.  Neurological:     General: No  focal deficit present.     Mental Status: She is alert and oriented to person, place, and time.  Psychiatric:        Mood and Affect: Mood normal.        Behavior: Behavior normal.        Thought Content: Thought content normal.        Judgment: Judgment normal.    The 10-year ASCVD risk score (Arnett DK, et al., 2019) is: 4.1%    Assessment & Plan:   Problem List Items Addressed This Visit       Nervous and Auditory   Chronic midline low back pain with left-sided sciatica - Primary   Chronic, improving. Her lower back pain has improved with prednisone , but gabapentin  was discontinued due to side effects, including feeling drunk. Multilevel arthritis was noted on x-ray. We will start Lyrica  75mg  once daily for pain management, continue daily back stretches, and use Tylenol  as needed for pain. PDMP reviewed. Follow-up in 2 months.       Relevant Medications   pregabalin  (LYRICA ) 75 MG capsule   Other Visit Diagnoses       Screen for colon cancer       She is overdue for a colonoscopy due  to a history of polyps. Will request prior colonoscopy and place referral for new one. She states she is 1 year overdue.   Relevant Orders   Ambulatory referral to Gastroenterology       Return in about 2 months (around 01/15/2024) for CPE.    Tinnie DELENA Harada, NP

## 2023-11-17 NOTE — Patient Instructions (Signed)
 It was great to see you!  Keep doing the back stretches daily  Start lyrica  1 capsule daily for the back pain   Let's follow-up in 2 months, sooner if you have concerns.  If a referral was placed today, you will be contacted for an appointment. Please note that routine referrals can sometimes take up to 3-4 weeks to process. Please call our office if you haven't heard anything after this time frame.  Take care,  Tinnie Harada, NP

## 2023-12-02 ENCOUNTER — Ambulatory Visit: Payer: Self-pay | Admitting: Nurse Practitioner

## 2023-12-02 DIAGNOSIS — G8929 Other chronic pain: Secondary | ICD-10-CM

## 2023-12-02 NOTE — Telephone Encounter (Signed)
Chief Complaint: increased anxiety Symptoms: anxiety, anger, tearful Frequency: 6 days Pertinent Negatives: Patient denies SI/HI/AVH Disposition: [] ED /[] Urgent Care (no appt availability in office) / [] Appointment(In office/virtual)/ []  [place] [facility]/ [] Home Care/ [x] Refused Recommended Disposition /[] [place] [facility]/ [x]  Follow-up with PCP Additional Notes: Patient called stating she has increased anxiety, anger, panic x 6 days. Patient reports she began taking Lyrica for pain 11/17/23 and this is the only thing she can attribute the changes to. Per protocol, patient to be evaluated within 24 hours. No availability with PCP within timeframe. She is inquiring if she can stop taking the Lyrica. This RN contacted [facility] and spoke to [place], Charity fundraiser, who states she will route to [employer] for review. Care advice reviewed, patient verbalized understanding. Alerting PCP for review.   Copied from CRM [id number]. Topic: Clinical - Red Word Triage >> Dec 02, 2023  1:14 PM [place] wrote: Red Word that prompted transfer to Nurse Triage:   Having a hard time taking lyrica- crying or feeling like shes having a panic attack. screaming rage. This is not like her at all and she is worried. This all started this week. Reason for Disposition  Patient sounds very upset or troubled to the triager  Answer Assessment - Initial Assessment Questions 1. CONCERN: "Did anything happen that prompted you to call today?"      Sitting outside the grocery store, states she feels panicked, on edge. 2. ANXIETY SYMPTOMS: "Can you describe how you (your loved one; patient) have been feeling?" (e.g., tense, restless, panicky, anxious, keyed up, overwhelmed, sense of impending doom).      Panicky, anxious, tearful, raging, shaky 3. ONSET: "How long have you been feeling this way?" (e.g., hours, days, weeks)     6 days 4. SEVERITY: "How would you rate the level of anxiety?" (e.g., 0 - 10; or mild, moderate, severe).      8/10 5. FUNCTIONAL IMPAIRMENT: "How have these feelings affected your ability to do daily activities?" "Have you had more difficulty than usual doing your normal daily activities?" (e.g., getting better, same, worse; self-care, school, work, interactions)     States she is angry, feels on edge, tearful or raging. 6. HISTORY: "Have you felt this way before?" "Have you ever been diagnosed with an anxiety problem in the past?" (e.g., generalized anxiety disorder, panic attacks, PTSD). If Yes, ask: "How was this problem treated?" (e.g., medicines, counseling, etc.)     Hx of anxiety, states this feels elevated in comparison to normal 7. RISK OF HARM - SUICIDAL IDEATION: "Do you ever have thoughts of hurting or killing yourself?" If Yes, ask:  "Do you have these feelings now?" "Do you have a plan on how you would do this?"     Denies SI/HI/AVH 8. TREATMENT:  "What has been done so far to treat this anxiety?" (e.g., medicines, relaxation strategies). "What has helped?"     Denies medication, states she just deals with it. Tries to distract herself 9. TREATMENT - THERAPIST: "Do you have a counselor or therapist? Name?"     Denies 10. POTENTIAL TRIGGERS: "Do you drink caffeinated beverages (e.g., coffee, colas, teas), and how much daily?" "Do you drink alcohol or use any drugs?" "Have you started any new medicines recently?"       Half of Dr. Reino Kent daily, denies ETOH, states she started Lyrica 11/17/23 11. PATIENT SUPPORT: "Who is with you now?" "Who do you live with?" "Do you have family or friends who you can talk to?"  She is alone, family and friends are upset with her currently. 12. OTHER SYMPTOMS: "Do you have any other symptoms?" (e.g., feeling depressed, trouble concentrating, trouble sleeping, trouble breathing, palpitations or fast heartbeat, chest pain, sweating, nausea, or diarrhea)       Trouble concentrating, wants to cry  Protocols used: Anxiety and Panic Attack-A-AH

## 2023-12-02 NOTE — Telephone Encounter (Signed)
I called and spoke with patient and notified her of message.

## 2023-12-02 NOTE — Telephone Encounter (Signed)
I called and spoke with patient and she said that she has not taken Gabapentin in 3-4 weeks and it made her feel intoxicated. She said that she never wants to take that medication again.

## 2023-12-10 DIAGNOSIS — Z419 Encounter for procedure for purposes other than remedying health state, unspecified: Secondary | ICD-10-CM | POA: Diagnosis not present

## 2023-12-15 ENCOUNTER — Other Ambulatory Visit: Payer: Self-pay | Admitting: Nurse Practitioner

## 2023-12-16 NOTE — Telephone Encounter (Signed)
 Requesting: montelukast  10 mg tablet  Last Visit: 11/17/2023 Next Visit: 01/24/2024 Last Refill: 06/28/2023  Please Advise

## 2023-12-23 ENCOUNTER — Ambulatory Visit: Payer: Self-pay | Admitting: Nurse Practitioner

## 2023-12-23 NOTE — Telephone Encounter (Signed)
Chief Complaint: abdominal pain Symptoms: 5/10 abd pain w/ diarrhea Frequency: 2 nights ago Pertinent Negatives: Patient denies fever, CP, SOB Disposition: [] ED /[] Urgent Care (no appt availability in office) / [x] Appointment(In office/virtual)/ []  [facility]/ [] Home Care/ [] Refused Recommended Disposition /[] [facility]/ []  Follow-up with PCP Additional Notes: Pt reports 5/10 abdominal pain, worse on the left side, with diarrhea. Pain started two nights ago and diarrhea started yesterday. Slightly nauseous, no vomiting. No diarrhea yet today. Hx of pancreatitis, states this feels like the same. Last hospitalized for it March 2024. Pt also states she has some sweat on her forehead and is shaky. States she has not eaten today but is drinking normally. Urinating normally. States she never eats before dinner time, so this is not abnormal for her. Denies CP, SOB, dizziness. Does state she feels "a little weak." RN advised pt she needs to be seen within the next 4 hours. 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 No availability today at [facility]. RN offered pt other appts with different offices near her client's home. Pt said she has to call the client before scheduling an appt and states she will call back to schedule. This RN finished pt's triage, pt just needs an appt today to be seen.     Copied from CRM [id number]. Topic: Clinical - Red Word Triage >> Dec 23, 2023 11:38 AM Florestine Avers wrote: Red Word that prompted transfer to Nurse Triage: Patient thinks she is having pancreatitis, pain in abdomen, diarrhea, and heart burn. Patient is requesting labs. Reason for Disposition  [1] MILD-MODERATE pain AND [2] constant AND [3] present > 2 hours  Answer Assessment - Initial Assessment Questions 1. LOCATION: "Where does it hurt?"      "Right in the middle between my ribs, worse on the L" 2. RADIATION: "Does the pain shoot anywhere else?" (e.g., chest, back)      No 3. ONSET: "When did the pain begin?" (e.g., minutes, hours or days ago)      2 days ago 4. SUDDEN: "Gradual or sudden onset?"     "Came on pretty sudden", "I thought it was heart burn so I let it pass then it got worse" 5. PATTERN "Does the pain come and go, or is it constant?"    - If it comes and goes: "How long does it last?" "Do you have pain now?"     (Note: Comes and goes means the pain is intermittent. It goes away completely between bouts.)    - If constant: "Is it getting better, staying the same, or getting worse?"      (Note: Constant means the pain never goes away completely; most serious pain is constant and gets worse.)      Constant - "I woke up early this AM very aware of it" 6. SEVERITY: "How bad is the pain?"  (e.g., Scale 1-10; mild, moderate, or severe)    - MILD (1-3): Doesn't interfere with normal activities, abdomen soft and not tender to touch.     - MODERATE (4-7): Interferes with normal activities or awakens from sleep, abdomen tender to touch.     - SEVERE (8-10): Excruciating pain, doubled over, unable to do any normal activities.       5/10 7. RECURRENT SYMPTOM: "Have you ever had this type of stomach pain before?" If Yes, ask: "When was the last time?" and "What happened that time?"      Yes, hx  of pancreatitis  8. CAUSE: "What do you think is causing the stomach pain?"     Pancreatitis - "I had a bout of it last year where I was hospitalized and then it flared up again", hospitalized back in March 2024 9. RELIEVING/AGGRAVATING FACTORS: "What makes it better or worse?" (e.g., antacids, bending or twisting motion, bowel movement)     "I feel better laying on my stomach" 10. OTHER SYMPTOMS: "Do you have any other symptoms?" (e.g., back pain, diarrhea, fever, urination pain, vomiting)       Diarrhea started yesterday (none today - no bloody stools), no vomiting, "a little" nausea but "not bad", "I haven't taken my temperature but I am sitting here and feel a  little damp and shaky", no dysuria. "I just feel under the weather." Pt states she has been shaky all day. Felt shaky last year with pancreatitis. Pt states she is still eating and drinking normally, states the diarrhea is not as severe as it has been. Last year was severe vomiting and diarrhea. "I do feel a little weak but not like I will pass out." No dizziness. Denies CP. No cardiac hx. No SOB. Has not eaten anything today - "I never eat before dinner. I only eat dinner. For my entire life." Not having eaten today is normal.  Protocols used: Abdominal Pain - Female-A-AH

## 2023-12-23 NOTE — Telephone Encounter (Signed)
Noted

## 2023-12-23 NOTE — Telephone Encounter (Signed)
Copied from CRM [id number]. Topic: General - Other >> Dec 23, 2023  3:35 PM Deaijah H wrote: Reason for CRM: Patient called in to let Dr. Norval Morton nurse know that her father is taking her to his gastroenterology in [facility]

## 2023-12-23 NOTE — Telephone Encounter (Signed)
Lvmtcb to schedule an appt with a provider.

## 2023-12-23 NOTE — Telephone Encounter (Signed)
This RN attempted to call back patient for scheduling, no answer, voicemail left requesting return call to clinic. Forwarding to office for review.

## 2024-01-07 DIAGNOSIS — Z419 Encounter for procedure for purposes other than remedying health state, unspecified: Secondary | ICD-10-CM | POA: Diagnosis not present

## 2024-01-17 ENCOUNTER — Encounter: Payer: Self-pay | Admitting: Nurse Practitioner

## 2024-01-18 ENCOUNTER — Telehealth: Admitting: Physician Assistant

## 2024-01-18 DIAGNOSIS — M545 Low back pain, unspecified: Secondary | ICD-10-CM

## 2024-01-18 MED ORDER — CYCLOBENZAPRINE HCL 10 MG PO TABS: 5.0000 mg | ORAL_TABLET | Freq: Three times a day (TID) | ORAL | 0 refills | Status: DC | PRN

## 2024-01-18 MED ORDER — METHYLPREDNISOLONE 4 MG PO TBPK: ORAL_TABLET | ORAL | 0 refills | Status: DC

## 2024-01-18 NOTE — Progress Notes (Signed)

## 2024-01-18 NOTE — Addendum Note (Signed)
 Addended by: Margaretann Loveless on: 01/18/2024 12:02 PM   Modules accepted: Orders

## 2024-01-24 ENCOUNTER — Encounter: Payer: Medicaid Other | Admitting: Nurse Practitioner

## 2024-01-24 DIAGNOSIS — F413 Other mixed anxiety disorders: Secondary | ICD-10-CM | POA: Diagnosis not present

## 2024-01-24 DIAGNOSIS — F900 Attention-deficit hyperactivity disorder, predominantly inattentive type: Secondary | ICD-10-CM | POA: Diagnosis not present

## 2024-02-07 DIAGNOSIS — F900 Attention-deficit hyperactivity disorder, predominantly inattentive type: Secondary | ICD-10-CM | POA: Diagnosis not present

## 2024-02-07 DIAGNOSIS — F413 Other mixed anxiety disorders: Secondary | ICD-10-CM | POA: Diagnosis not present

## 2024-02-14 ENCOUNTER — Other Ambulatory Visit: Payer: Self-pay | Admitting: Nurse Practitioner

## 2024-02-14 NOTE — Telephone Encounter (Signed)
 Requesting: pantoprazole 40 mg tablet,delayed release  Last Visit: 11/17/2023 Next Visit: Visit date not found Last Refill: 03/14/2023  Please Advise

## 2024-02-18 DIAGNOSIS — Z419 Encounter for procedure for purposes other than remedying health state, unspecified: Secondary | ICD-10-CM | POA: Diagnosis not present

## 2024-03-07 DIAGNOSIS — F900 Attention-deficit hyperactivity disorder, predominantly inattentive type: Secondary | ICD-10-CM | POA: Diagnosis not present

## 2024-03-07 DIAGNOSIS — F413 Other mixed anxiety disorders: Secondary | ICD-10-CM | POA: Diagnosis not present

## 2024-03-19 DIAGNOSIS — Z419 Encounter for procedure for purposes other than remedying health state, unspecified: Secondary | ICD-10-CM | POA: Diagnosis not present

## 2024-04-04 DIAGNOSIS — F413 Other mixed anxiety disorders: Secondary | ICD-10-CM | POA: Diagnosis not present

## 2024-04-04 DIAGNOSIS — F900 Attention-deficit hyperactivity disorder, predominantly inattentive type: Secondary | ICD-10-CM | POA: Diagnosis not present

## 2024-04-13 ENCOUNTER — Other Ambulatory Visit: Payer: Self-pay | Admitting: Nurse Practitioner

## 2024-04-13 NOTE — Telephone Encounter (Signed)
 Requesting: pantoprazole  40 mg tablet,delayed release  Last Visit: 11/17/2023 Next Visit: Visit date not found Last Refill: 02/14/2024  Please Advise

## 2024-04-19 DIAGNOSIS — Z419 Encounter for procedure for purposes other than remedying health state, unspecified: Secondary | ICD-10-CM | POA: Diagnosis not present

## 2024-05-02 DIAGNOSIS — F413 Other mixed anxiety disorders: Secondary | ICD-10-CM | POA: Diagnosis not present

## 2024-05-02 DIAGNOSIS — F900 Attention-deficit hyperactivity disorder, predominantly inattentive type: Secondary | ICD-10-CM | POA: Diagnosis not present

## 2024-05-15 ENCOUNTER — Encounter: Payer: Self-pay | Admitting: Nurse Practitioner

## 2024-05-15 ENCOUNTER — Ambulatory Visit: Payer: Self-pay

## 2024-05-15 MED ORDER — PANTOPRAZOLE SODIUM 40 MG PO TBEC: 40.0000 mg | DELAYED_RELEASE_TABLET | Freq: Every day | ORAL | 0 refills | Status: DC

## 2024-05-15 NOTE — Telephone Encounter (Signed)
 Requesting: Pantoprazole  Last Visit: 11/17/2023 Next Visit: Visit date not found Last Refill: 04/13/2024  Please Advise

## 2024-05-15 NOTE — Telephone Encounter (Signed)
 Copied from CRM [id number]. Topic: General - Other >> May 15, 2024 10:40 AM Deborah Peterson wrote: Reason for CRM: Pt stated that she thinks she has a broken toe and would like to come in for a xray. Pt stated that her toe is swollen, purple,and in pain. Pt declined speaking with NT and is wanting to go to the [facility] location for the xray due to it being closer. Pt would like a callback with an update on whether she can go to that location for the xray. >> May 15, 2024  4:28 PM Deborah Peterson wrote: Patient calling on an update to see if she can come in

## 2024-05-15 NOTE — Telephone Encounter (Signed)
 Left message for patient to return call.

## 2024-05-15 NOTE — Telephone Encounter (Signed)
 FYI Only or Action Required?: FYI only for provider.  Patient was last seen in primary care on 11/17/2023 by Nedra Tinnie LABOR, NP.  Called Nurse Triage reporting Toe Injury.  Symptoms began yesterday.  Interventions attempted: OTC medications: tylenol  and Rest, hydration, or home remedies.  Symptoms are: unchanged.  Triage Disposition: See Physician Within 24 Hours  Patient/caregiver understands and will follow disposition?: Yes  Copied from CRM [id number]. Topic: Clinical - Red Word Triage >> May 15, 2024  4:32 PM Marissa P wrote: Red Word that prompted transfer to Nurse Triage: Patient called in stating that her toe is broken left foot next to pink toe. Last night 06:30pm is around the time it happened. Patient is in lots of pain Reason for Disposition  [1] Toe injury AND [2] bad limp or can't wear shoes/sandals  Answer Assessment - Initial Assessment Questions 1. MECHANISM: How did the injury happen?      Patient states she was trying to get to her dog and left foot went into the ottoman 2. ONSET: When did the injury happen? (Minutes or hours ago)      Happened last night 3. LOCATION: What part of the toe is injured? Is the nail damaged?      Left foot-number 4 toe.patient states no damage to the toe 4. APPEARANCE of TOE INJURY: What does the injury look like?      Toe is swollen and shiny.  5. SEVERITY: Can you use the foot normally? Can you walk?      Can use the foot but is having to be careful with position.  6. SIZE: For cuts, bruises, or swelling, ask: How large is it? (e.g., inches or centimeters;  entire toe)      No cuts 7. PAIN: Is there pain? If Yes, ask: How bad is the pain?   (e.g., Scale 1-10; or mild, moderate, severe)     5 out of 10-reports tylenol  usage 8. TETANUS: For any breaks in the skin, ask: When was the last tetanus booster?     N/a 9. DIABETES: Do you have a history of diabetes or poor circulation in the feet?     no 10. OTHER  SYMPTOMS: Do you have any other symptoms?        no  Protocols used: Toe Injury-A-AH

## 2024-05-16 ENCOUNTER — Encounter: Payer: Self-pay | Admitting: Internal Medicine

## 2024-05-16 ENCOUNTER — Ambulatory Visit: Admitting: Internal Medicine

## 2024-05-16 ENCOUNTER — Ambulatory Visit (INDEPENDENT_AMBULATORY_CARE_PROVIDER_SITE_OTHER)

## 2024-05-16 VITALS — BP 106/70 | HR 94 | Temp 98.1°F | Ht 61.0 in | Wt 107.0 lb

## 2024-05-16 DIAGNOSIS — S99922A Unspecified injury of left foot, initial encounter: Secondary | ICD-10-CM

## 2024-05-16 DIAGNOSIS — M79675 Pain in left toe(s): Secondary | ICD-10-CM | POA: Diagnosis not present

## 2024-05-16 NOTE — Patient Instructions (Addendum)
 Tylenol  500mg  every 8 hours as needed for pain  Ice  Buddy tape 3rd and 4th toe together  Elevate when possible   Go for xray

## 2024-05-16 NOTE — Telephone Encounter (Signed)
 Noted. Patient was scheduled for an office appointment for 05/16/24.

## 2024-05-16 NOTE — Progress Notes (Signed)
 [facility] [address] [place] [address] Dept: [phone] Dept Fax: [phone]  Acute Care Office Visit  Subjective:   Deborah Peterson 09/10/1966 05/16/2024  Chief Complaint  Patient presents with   Toe Pain    Happened Night before last     HPI:  Discussed the use of AI scribe software for clinical note transcription with the patient, who gave verbal consent to proceed.  History of Present Illness   Deborah Peterson is a 57 year old female who presents with left fourth toe pain following trauma.  She injured her left fourth toe after accidentally hitting it against the leg of an ottoman while managing her dog. This incident occurred on Monday night, two days prior to the visit. She recalls hearing a 'pop' at the time of injury.  The toe is swollen and painful, with difficulty wearing shoes and walking. The pain is localized to the tip of the left fourth toe. 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 She is unable to take ibuprofen or other anti-inflammatories due to a history of stomach ulcers. She has been using Tylenol  for pain management, taking 500 mg tablets more frequently than recommended, approximately every three to four hours.  No pain in the ankle, midfoot, big toe, second toe, third toe, and pinky toe. Pain is localized to the tip of the left fourth toe.       The following portions of the patient's history were reviewed and updated as appropriate: past medical history, past surgical history, family history, social history, allergies, medications, and problem list.   Patient Active Problem List   Diagnosis Date Noted   Chronic midline low back pain with left-sided sciatica 10/19/2023   Dizziness 10/19/2023   Primary insomnia 10/19/2023    Vitamin B12 deficiency 10/19/2023   Pure hypercholesterolemia 10/19/2023   Essential tremor 10/19/2023   Shortness of breath 06/28/2023   Alcohol-induced chronic pancreatitis (HCC) 02/18/2023   Alcohol abuse 02/06/2023   Acute pancreatitis 02/05/2023   Hypokalemia 02/05/2023   Leukocytosis 02/05/2023   Pulmonary nodule 02/05/2023   Concentration deficit 03/19/2022   Anxiety 03/03/2022   Brain fog 03/03/2022   Gastroesophageal reflux disease 03/03/2022   Left hip pain 03/03/2022   Chronic fatigue 03/03/2022   Tobacco use 03/03/2022   Past Medical History:  Diagnosis Date   Alcoholism (HCC)    Allergy 1985   Anxiety 1970   GERD (gastroesophageal reflux disease) 2017   Ulcer 2019   gastric ulcers   Past Surgical History:  Procedure Laterality Date   ABDOMINAL HYSTERECTOMY     CESAREAN SECTION     TUBAL LIGATION  1996   Family History  Problem Relation Age of Onset   Breast cancer Mother    Cancer Mother        breast   Alzheimer's disease Mother    Heart disease Father    Breast cancer Maternal Aunt    Breast cancer Maternal Grandmother    Arthritis Maternal Grandmother    Vision loss Maternal Grandmother    Cancer Paternal Grandmother        ovarian   COPD Paternal Grandmother    Vision loss Paternal Grandmother    Heart disease Paternal Grandfather    ADD / ADHD Son     Current Outpatient Medications:  acetaminophen  (TYLENOL ) 500 MG tablet, Take 500 mg by mouth every 8 (eight) hours as needed for moderate pain., Disp: , Rfl:    albuterol  (VENTOLIN  HFA) 108 (90 Base) MCG/ACT inhaler, Inhale 2 puffs into the lungs every 6 (six) hours as needed for wheezing or shortness of breath., Disp: 8 g, Rfl: 0   Multiple Vitamin (MULTIVITAMIN WITH MINERALS) TABS tablet, Take 1 tablet by mouth daily., Disp: , Rfl:    pantoprazole  (PROTONIX ) 40 MG tablet, Take 1 tablet (40 mg total) by mouth daily., Disp: 30 tablet, Rfl: 0   simethicone  (GAS-X) 80 MG chewable tablet, Chew 1  tablet (80 mg total) by mouth every 6 (six) hours as needed for flatulence., Disp: 30 tablet, Rfl: 0   cyclobenzaprine  (FLEXERIL ) 10 MG tablet, Take 0.5-1 tablets (5-10 mg total) by mouth 3 (three) times daily as needed. (Patient not taking: Reported on 05/16/2024), Disp: 30 tablet, Rfl: 0   diphenhydrAMINE HCl (BENADRYL ALLERGY PO), Take by mouth as needed. (Patient not taking: Reported on 05/16/2024), Disp: , Rfl:    folic acid  (FOLVITE ) 1 MG tablet, Take 1 tablet (1 mg total) by mouth daily. (Patient not taking: Reported on 05/16/2024), Disp: 30 tablet, Rfl: 0   methylPREDNISolone  (MEDROL  DOSEPAK) 4 MG TBPK tablet, 6 day taper; take as directed on package instructions (Patient not taking: Reported on 05/16/2024), Disp: 21 tablet, Rfl: 0 Allergies  Allergen Reactions   Erythromycin Anaphylaxis   Motrin [Ibuprofen]     unknown   Penicillins     unknown   Tramadol     unknown   Ciprofloxacin Itching     ROS: A complete ROS was performed with pertinent positives/negatives noted in the HPI. The remainder of the ROS are negative.    Objective:   Today's Vitals   05/16/24 1538  BP: 106/70  Pulse: 94  Temp: 98.1 F (36.7 C)  TempSrc: Temporal  SpO2: 95%  Weight: 107 lb (48.5 kg)  Height: 5' 1 (1.549 m)    GENERAL: Well-appearing, in NAD. Well nourished.  SKIN: Pink, warm and dry.  MSK: Muscle tone and strength appropriate for age. Tenderness and moderate swelling to left 4th toe.  EXTREMITIES: Without clubbing, cyanosis, or edema. 2+ pedal pulses.  NEUROLOGIC: No sensory deficits. Steady, even gait.  PSYCH/MENTAL STATUS: Alert, oriented x 3. Cooperative, appropriate mood and affect.    No results found for any visits on 05/16/24.    Assessment & Plan:  Assessment and Plan    Left fourth toe injury Possible fracture or severe contusion. X-ray needed to assess for fracture. NSAIDs contraindicated due to stomach ulcers. - Order x-ray of left fourth toe. - Recommend acetaminophen   500 mg every 6-8 hours as needed for pain, not exceeding 4000 mg in 24 hours. - Advise ice packs - Instruct to elevate the foot when possible. - Recommend buddy taping the fourth toe to the third toe for support. - Refer to [facility] or [facility] for x-ray. - Follow up once x-ray results are available.   No orders of the defined types were placed in this encounter.  Orders Placed This Encounter  Procedures   DG Toe 4th Left    Standing Status:   Future    Number of Occurrences:   1    Expiration Date:   05/16/2025    Reason for exam::   recent injury    Is the patient pregnant?:   No    Preferred imaging location?:   Internal  Release to patient:   Immediate   Lab Orders  No laboratory test(s) ordered today   No images are attached to the encounter or orders placed in the encounter.  Return if symptoms worsen or fail to improve.   Rosina Senters, FNP

## 2024-05-17 ENCOUNTER — Ambulatory Visit: Payer: Self-pay | Admitting: Internal Medicine

## 2024-05-19 DIAGNOSIS — Z419 Encounter for procedure for purposes other than remedying health state, unspecified: Secondary | ICD-10-CM | POA: Diagnosis not present

## 2024-06-12 DIAGNOSIS — F900 Attention-deficit hyperactivity disorder, predominantly inattentive type: Secondary | ICD-10-CM | POA: Diagnosis not present

## 2024-06-12 DIAGNOSIS — F413 Other mixed anxiety disorders: Secondary | ICD-10-CM | POA: Diagnosis not present

## 2024-06-19 DIAGNOSIS — Z419 Encounter for procedure for purposes other than remedying health state, unspecified: Secondary | ICD-10-CM | POA: Diagnosis not present

## 2024-07-01 ENCOUNTER — Telehealth: Admitting: Family

## 2024-07-01 DIAGNOSIS — K047 Periapical abscess without sinus: Secondary | ICD-10-CM

## 2024-07-01 MED ORDER — CLINDAMYCIN HCL 300 MG PO CAPS: 300.0000 mg | ORAL_CAPSULE | Freq: Three times a day (TID) | ORAL | 0 refills | Status: AC

## 2024-07-01 NOTE — Progress Notes (Signed)

## 2024-07-20 DIAGNOSIS — Z419 Encounter for procedure for purposes other than remedying health state, unspecified: Secondary | ICD-10-CM | POA: Diagnosis not present

## 2024-07-23 ENCOUNTER — Encounter: Payer: Self-pay | Admitting: Nurse Practitioner

## 2024-07-23 MED ORDER — PANTOPRAZOLE SODIUM 40 MG PO TBEC: 40.0000 mg | DELAYED_RELEASE_TABLET | Freq: Every day | ORAL | 0 refills | Status: DC

## 2024-07-23 NOTE — Telephone Encounter (Signed)
 Requesting: Pantoprazole  Last Visit: 11/17/2023 Next Visit: Visit date not found Last Refill: 05/15/2024  Please Advise

## 2024-07-31 ENCOUNTER — Telehealth: Payer: Self-pay

## 2024-07-31 DIAGNOSIS — L989 Disorder of the skin and subcutaneous tissue, unspecified: Secondary | ICD-10-CM

## 2024-07-31 NOTE — Telephone Encounter (Unsigned)
 Copied from CRM #[id number]. Topic: Referral - Request for Referral >> Jul 31, 2024  1:59 PM Berneda FALCON wrote: Did the patient discuss referral with their provider in the last year? No (If No - schedule appointment) (If Yes - send message) Yes, has an upcoming appt  Appointment offered? Yes  Type of order/referral and detailed reason for visit: Patient states she has a concerning spot on her upper left arm and would like a referral to a dermatologist, please.  Preference of office, provider, location: Just a derm in [place] please  If referral order, have you been seen by this specialty before? No (If Yes, this issue or another issue? When? Where?  Can we respond through MyChart? Yes

## 2024-08-01 NOTE — Telephone Encounter (Signed)
 Left patient a detailed message.

## 2024-08-02 NOTE — Telephone Encounter (Signed)
 Patient return call and has been made aware of chart notations.

## 2024-08-08 ENCOUNTER — Other Ambulatory Visit: Payer: Self-pay | Admitting: Medical Genetics

## 2024-08-15 ENCOUNTER — Ambulatory Visit (INDEPENDENT_AMBULATORY_CARE_PROVIDER_SITE_OTHER): Admitting: Nurse Practitioner

## 2024-08-15 ENCOUNTER — Encounter: Payer: Self-pay | Admitting: Nurse Practitioner

## 2024-08-15 ENCOUNTER — Ambulatory Visit: Payer: Self-pay | Admitting: Nurse Practitioner

## 2024-08-15 VITALS — BP 108/60 | HR 82 | Temp 96.8°F | Ht 61.0 in | Wt 107.8 lb

## 2024-08-15 DIAGNOSIS — Z23 Encounter for immunization: Secondary | ICD-10-CM

## 2024-08-15 DIAGNOSIS — J339 Nasal polyp, unspecified: Secondary | ICD-10-CM | POA: Diagnosis not present

## 2024-08-15 DIAGNOSIS — Z0000 Encounter for general adult medical examination without abnormal findings: Secondary | ICD-10-CM | POA: Diagnosis not present

## 2024-08-15 DIAGNOSIS — L989 Disorder of the skin and subcutaneous tissue, unspecified: Secondary | ICD-10-CM | POA: Diagnosis not present

## 2024-08-15 DIAGNOSIS — R911 Solitary pulmonary nodule: Secondary | ICD-10-CM | POA: Diagnosis not present

## 2024-08-15 DIAGNOSIS — Z1231 Encounter for screening mammogram for malignant neoplasm of breast: Secondary | ICD-10-CM

## 2024-08-15 DIAGNOSIS — E538 Deficiency of other specified B group vitamins: Secondary | ICD-10-CM | POA: Diagnosis not present

## 2024-08-15 DIAGNOSIS — K219 Gastro-esophageal reflux disease without esophagitis: Secondary | ICD-10-CM

## 2024-08-15 DIAGNOSIS — Z1211 Encounter for screening for malignant neoplasm of colon: Secondary | ICD-10-CM

## 2024-08-15 DIAGNOSIS — E7800 Pure hypercholesterolemia, unspecified: Secondary | ICD-10-CM

## 2024-08-15 DIAGNOSIS — Z720 Tobacco use: Secondary | ICD-10-CM | POA: Diagnosis not present

## 2024-08-15 LAB — COMPREHENSIVE METABOLIC PANEL WITH GFR
ALT: 7 U/L (ref 0–35)
AST: 15 U/L (ref 0–37)
Albumin: 3.9 g/dL (ref 3.5–5.2)
Alkaline Phosphatase: 78 U/L (ref 39–117)
BUN: 6 mg/dL (ref 6–23)
CO2: 28 meq/L (ref 19–32)
Calcium: 9.1 mg/dL (ref 8.4–10.5)
Chloride: 100 meq/L (ref 96–112)
Creatinine, Ser: 0.5 mg/dL (ref 0.40–1.20)
GFR: 103.11 mL/min (ref >60.00)
Glucose, Bld: 88 mg/dL (ref 70–99)
Potassium: 3.9 meq/L (ref 3.5–5.1)
Sodium: 137 meq/L (ref 135–145)
Total Bilirubin: 0.2 mg/dL (ref 0.2–1.2)
Total Protein: 6.5 g/dL (ref 6.0–8.3)

## 2024-08-15 LAB — LIPID PANEL
Cholesterol: 239 mg/dL — ABNORMAL HIGH (ref 0–200)
HDL: 55.7 mg/dL (ref >39.00)
LDL Cholesterol: 164 mg/dL — ABNORMAL HIGH (ref 0–99)
NonHDL: 183.25
Total CHOL/HDL Ratio: 4
Triglycerides: 97 mg/dL (ref 0.0–149.0)
VLDL: 19.4 mg/dL (ref 0.0–40.0)

## 2024-08-15 LAB — CBC WITH DIFFERENTIAL/PLATELET
Basophils Absolute: 0.1 K/uL (ref 0.0–0.1)
Basophils Relative: 1.1 % (ref 0.0–3.0)
Eosinophils Absolute: 0.2 K/uL (ref 0.0–0.7)
Eosinophils Relative: 3.3 % (ref 0.0–5.0)
HCT: 37.4 % (ref 36.0–46.0)
Hemoglobin: 12.8 g/dL (ref 12.0–15.0)
Lymphocytes Relative: 36 % (ref 12.0–46.0)
Lymphs Abs: 1.8 K/uL (ref 0.7–4.0)
MCHC: 34.2 g/dL (ref 30.0–36.0)
MCV: 101.7 fl — ABNORMAL HIGH (ref 78.0–100.0)
Monocytes Absolute: 0.4 K/uL (ref 0.1–1.0)
Monocytes Relative: 8.8 % (ref 3.0–12.0)
Neutro Abs: 2.6 K/uL (ref 1.4–7.7)
Neutrophils Relative %: 50.8 % (ref 43.0–77.0)
Platelets: 338 K/uL (ref 150.0–400.0)
RBC: 3.68 Mil/uL — ABNORMAL LOW (ref 3.87–5.11)
RDW: 12.3 % (ref 11.5–15.5)
WBC: 5 K/uL (ref 4.0–10.5)

## 2024-08-15 LAB — VITAMIN B12: Vitamin B-12: 208 pg/mL — ABNORMAL LOW (ref 211–911)

## 2024-08-15 MED ORDER — PANTOPRAZOLE SODIUM 40 MG PO TBEC: 40.0000 mg | DELAYED_RELEASE_TABLET | Freq: Every day | ORAL | 1 refills | Status: AC

## 2024-08-15 MED ORDER — COVID-19 MRNA VACC (MODERNA) 50 MCG/0.5ML IM SUSY: 0.5000 mL | PREFILLED_SYRINGE | Freq: Once | INTRAMUSCULAR | 0 refills | Status: AC

## 2024-08-15 NOTE — Assessment & Plan Note (Signed)
Chronic, stable. She takes pantoprazole 40mg  daily and follows routinely with GI. Continue collaboration and recommendations from GI.  ?

## 2024-08-15 NOTE — Patient Instructions (Signed)
 It was great to see you!  We are checking your labs today and will let you know the results via mychart/phone.   I have placed a referral to ENT and GI - they will call you  You are scheduled for a mammogram on 08/27/24 at 1:20pm   I have ordered a CT of your chest to screen for lung cancer - they will call to schedule   Let's follow-up in 1 year, sooner if you have concerns.  If a referral was placed today, you will be contacted for an appointment. Please note that routine referrals can sometimes take up to 3-4 weeks to process. Please call our office if you haven't heard anything after this time frame.  Take care,  Tinnie Harada, NP

## 2024-08-15 NOTE — Progress Notes (Signed)
 BP 108/60 (BP Location: Left Arm, Patient Position: Sitting, Cuff Size: Normal)   Pulse 82   Temp (!) 96.8 F (36 C)   Ht 5' 1 (1.549 m)   Wt 107 lb 12.8 oz (48.9 kg)   SpO2 99%   BMI 20.37 kg/m    Subjective:    Patient ID: Deborah Peterson, female    DOB: [birth year], 58 y.o.   MRN: [id number]  CC: Chief Complaint  Patient presents with   Annual Exam    With fasting labs, request referral to ENT, Covid Rx    HPI: Deborah Peterson is a 58 y.o. female presenting on 08/15/2024 for comprehensive medical examination. Current medical complaints include:nasal polyps, skin lesion  Discussed the use of AI scribe software for clinical note transcription with the patient, who gave verbal consent to proceed.  She has nasal polyps that cause her nasal passages to feel narrow and experiences ear fullness. She believes her tonsils may contribute to episodes of coughing, choking, and gagging. These symptoms are persistent.  She is concerned about a spot on her arm that appeared recently, became crusty, and caught on her shirt about four or five days ago. The crust has since come off. 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      She currently lives with: son Menopausal Symptoms: no  Depression and Anxiety Screen done today and results listed below:     08/15/2024    9:34 AM 02/18/2023    2:24 PM 03/19/2022    9:47 AM 03/03/2022    2:01 PM  Depression screen PHQ 2/9  Decreased Interest 0 0 1 2  Down, Depressed, Hopeless 0 0 0 0  PHQ - 2 Score 0 0 1 2  Altered sleeping 0 0 0 0  Tired, decreased energy 0 3 3 2   Change in appetite 0 3 0 0  Feeling bad or failure about yourself  0 0 1 0  Trouble concentrating 0 2 3 3   Moving slowly or fidgety/restless 0 1 1 0  Suicidal thoughts 0 0 0 0  PHQ-9 Score 0 9 9 7   Difficult doing work/chores Not difficult at all Very difficult Very difficult Somewhat difficult       08/15/2024    9:34 AM 02/18/2023    2:28 PM 03/19/2022    9:47 AM 03/03/2022    2:01 PM  GAD 7 : Generalized Anxiety Score  Nervous, Anxious, on Edge 0 2 3 1   Control/stop worrying 0 2 0 2  Worry too much - different things 0 1 0 2  Trouble relaxing 0 1 2 0  Restless 0 0  0  Easily annoyed or irritable 0 0 0 1  Afraid - awful might happen 0 1 0 1  Total GAD 7 Score 0 7  7  Anxiety Difficulty Not difficult at all Somewhat difficult Very difficult     The patient does not have a history of falls. I did not complete a risk assessment for falls. A plan of care for falls was not documented.   Past Medical History:  Past Medical History:  Diagnosis Date   Alcoholism [facility])    Allergy 1985   Anxiety 1970   GERD (gastroesophageal reflux disease) 2017   Ulcer 2019   gastric ulcers    Surgical History:  Past Surgical History:  Procedure Laterality Date   ABDOMINAL HYSTERECTOMY     CESAREAN SECTION  TUBAL LIGATION  1996    Medications:  Current Outpatient Medications on File Prior to Visit  Medication Sig   acetaminophen  (TYLENOL ) 500 MG tablet Take 500 mg by mouth every 8 (eight) hours as needed for moderate pain.   albuterol  (VENTOLIN  HFA) 108 (90 Base) MCG/ACT inhaler Inhale 2 puffs into the lungs every 6 (six) hours as needed for wheezing or shortness of breath.   amphetamine-dextroamphetamine (ADDERALL) 10 MG tablet Take 10 mg by mouth 2 (two) times daily.   Multiple Vitamin (MULTIVITAMIN WITH MINERALS) TABS tablet Take 1 tablet by mouth daily.   simethicone  (GAS-X) 80 MG chewable tablet Chew 1 tablet (80 mg total) by mouth every 6 (six) hours as needed for flatulence.   folic acid  (FOLVITE ) 1 MG tablet Take 1 tablet (1 mg total) by mouth daily. (Patient not taking: Reported on 08/15/2024)   No current facility-administered medications on file prior to visit.    Allergies:  Allergies  Allergen Reactions   Erythromycin Anaphylaxis   Motrin [Ibuprofen]     unknown    Penicillins     unknown   Tramadol     unknown   Ciprofloxacin Itching    Social History:  Social History   Socioeconomic History   Marital status: Divorced    Spouse name: Not on file   Number of children: Not on file   Years of education: Not on file   Highest education level: Associate degree: occupational, Scientist, product/process development, or vocational program  Occupational History   Not on file  Tobacco Use   Smoking status: Every Day    Current packs/day: 0.25    Average packs/day: 0.8 packs/day for 44.8 years (33.9 ttl pk-yrs)    Types: Cigarettes    Start date: 38   Smokeless tobacco: Never   Tobacco comments:    Down to 5 cigarette a day   Vaping Use   Vaping status: Never Used  Substance and Sexual Activity   Alcohol use: Not Currently    Alcohol/week: 6.0 standard drinks of alcohol    Types: 6 Cans of beer per week   Drug use: Never   Sexual activity: Not Currently    Birth control/protection: Abstinence, Surgical  Other Topics Concern   Not on file  Social History Narrative   Right handed    Lives in a one story home    Drinks Caffeine    Social Drivers of Health   Financial Resource Strain: High Risk (10/18/2023)   Overall Financial Resource Strain (CARDIA)    Difficulty of Paying Living Expenses: Hard  Food Insecurity: No Food Insecurity (10/18/2023)   Hunger Vital Sign    Worried About Running Out of Food in the Last Year: Never true    Ran Out of Food in the Last Year: Never true  Transportation Needs: No Transportation Needs (10/18/2023)   PRAPARE - Administrator, Civil Service (Medical): No    Lack of Transportation (Non-Medical): No  Physical Activity: Sufficiently Active (10/18/2023)   Exercise Vital Sign    Days of Exercise per Week: 5 days    Minutes of Exercise per Session: 150+ min  Stress: Stress Concern Present (10/18/2023)   [employer] of Occupational Health - Occupational Stress Questionnaire    Feeling of Stress : Very much   Social Connections: Socially Isolated (10/18/2023)   Social Connection and Isolation Panel    Frequency of Communication with Friends and Family: More than three times a week    Frequency of Social  Gatherings with Friends and Family: Twice a week    Attends Religious Services: Never    Database administrator or Organizations: No    Attends Engineer, structural: Not on file    Marital Status: Divorced  Intimate Partner Violence: Unknown (05/28/2023)   Received from [facility]   HITS    Physically Hurt: Not on file    Insult or Talk Down To: Not on file    Threaten Physical Harm: Not on file    Scream or Curse: Not on file   Social History   Tobacco Use  Smoking Status Every Day   Current packs/day: 0.25   Average packs/day: 0.8 packs/day for 44.8 years (33.9 ttl pk-yrs)   Types: Cigarettes   Start date: 39  Smokeless Tobacco Never  Tobacco Comments   Down to 5 cigarette a day    Social History   Substance and Sexual Activity  Alcohol Use Not Currently   Alcohol/week: 6.0 standard drinks of alcohol   Types: 6 Cans of beer per week    Family History:  Family History  Problem Relation Age of Onset   Breast cancer Mother    Cancer Mother        breast   Alzheimer's disease Mother    Heart disease Father    Breast cancer Maternal Aunt    Breast cancer Maternal Grandmother    Arthritis Maternal Grandmother    Vision loss Maternal Grandmother    Cancer Paternal Grandmother        ovarian   COPD Paternal Grandmother    Vision loss Paternal Grandmother    Heart disease Paternal Grandfather    ADD / ADHD Son     Past medical history, surgical history, medications, allergies, family history and social history reviewed with patient today and changes made to appropriate areas of the chart.   Review of Systems  Constitutional: Negative.   HENT:         Swollen nasal passage, feels like food gets stuck in her throat  Eyes: Negative.   Respiratory:  Negative.    Cardiovascular: Negative.   Gastrointestinal: Negative.   Genitourinary: Negative.   Musculoskeletal: Negative.   Skin: Negative.   Neurological: Negative.   Psychiatric/Behavioral: Negative.     All other ROS negative except what is listed above and in the HPI.      Objective:    BP 108/60 (BP Location: Left Arm, Patient Position: Sitting, Cuff Size: Normal)   Pulse 82   Temp (!) 96.8 F (36 C)   Ht 5' 1 (1.549 m)   Wt 107 lb 12.8 oz (48.9 kg)   SpO2 99%   BMI 20.37 kg/m   Wt Readings from Last 3 Encounters:  08/15/24 107 lb 12.8 oz (48.9 kg)  05/16/24 107 lb (48.5 kg)  11/17/23 113 lb 9.6 oz (51.5 kg)    Physical Exam Vitals and nursing note reviewed.  Constitutional:      General: She is not in acute distress.    Appearance: Normal appearance.  HENT:     Head: Normocephalic and atraumatic.     Right Ear: Tympanic membrane, ear canal and external ear normal.     Left Ear: Tympanic membrane, ear canal and external ear normal.     Mouth/Throat:     Mouth: Mucous membranes are moist.     Pharynx: No posterior oropharyngeal erythema.  Eyes:     Conjunctiva/sclera: Conjunctivae normal.  Cardiovascular:     Rate and  Rhythm: Normal rate and regular rhythm.     Pulses: Normal pulses.     Heart sounds: Normal heart sounds.  Pulmonary:     Effort: Pulmonary effort is normal.     Breath sounds: Normal breath sounds.  Abdominal:     Palpations: Abdomen is soft.     Tenderness: There is no abdominal tenderness.  Musculoskeletal:        General: Normal range of motion.     Cervical back: Normal range of motion and neck supple.     Right lower leg: No edema.     Left lower leg: No edema.  Lymphadenopathy:     Cervical: No cervical adenopathy.  Skin:    General: Skin is warm and dry.     Comments: Scaly, circular lesion to left upper arm  Neurological:     General: No focal deficit present.     Mental Status: She is alert and oriented to person, place,  and time.     Cranial Nerves: No cranial nerve deficit.     Coordination: Coordination normal.     Gait: Gait normal.  Psychiatric:        Mood and Affect: Mood normal.        Behavior: Behavior normal.        Thought Content: Thought content normal.        Judgment: Judgment normal.     Results for orders placed or performed in visit on 08/15/24  CBC with Differential/Platelet   Collection Time: 08/15/24 10:10 AM  Result Value Ref Range   WBC 5.0 4.0 - 10.5 K/uL   RBC 3.68 (L) 3.87 - 5.11 Mil/uL   Hemoglobin 12.8 12.0 - 15.0 g/dL   HCT 62.5 63.9 - 53.9 %   MCV 101.7 (H) 78.0 - 100.0 fl   MCHC 34.2 30.0 - 36.0 g/dL   RDW 87.6 88.4 - 84.4 %   Platelets 338.0 150.0 - 400.0 K/uL   Neutrophils Relative % 50.8 43.0 - 77.0 %   Lymphocytes Relative 36.0 12.0 - 46.0 %   Monocytes Relative 8.8 3.0 - 12.0 %   Eosinophils Relative 3.3 0.0 - 5.0 %   Basophils Relative 1.1 0.0 - 3.0 %   Neutro Abs 2.6 1.4 - 7.7 K/uL   Lymphs Abs 1.8 0.7 - 4.0 K/uL   Monocytes Absolute 0.4 0.1 - 1.0 K/uL   Eosinophils Absolute 0.2 0.0 - 0.7 K/uL   Basophils Absolute 0.1 0.0 - 0.1 K/uL  Comprehensive metabolic panel with GFR   Collection Time: 08/15/24 10:10 AM  Result Value Ref Range   Sodium 137 135 - 145 mEq/L   Potassium 3.9 3.5 - 5.1 mEq/L   Chloride 100 96 - 112 mEq/L   CO2 28 19 - 32 mEq/L   Glucose, Bld 88 70 - 99 mg/dL   BUN 6 6 - 23 mg/dL   Creatinine, Ser 9.49 0.40 - 1.20 mg/dL   Total Bilirubin 0.2 0.2 - 1.2 mg/dL   Alkaline Phosphatase 78 39 - 117 U/L   AST 15 0 - 37 U/L   ALT 7 0 - 35 U/L   Total Protein 6.5 6.0 - 8.3 g/dL   Albumin 3.9 3.5 - 5.2 g/dL   GFR 896.88 >39.99 mL/min   Calcium 9.1 8.4 - 10.5 mg/dL  Lipid panel   Collection Time: 08/15/24 10:10 AM  Result Value Ref Range   Cholesterol 239 (H) 0 - 200 mg/dL   Triglycerides 02.9 0.0 - 149.0 mg/dL  HDL 55.70 >39.00 mg/dL   VLDL 80.5 0.0 - 59.9 mg/dL   LDL Cholesterol 835 (H) 0 - 99 mg/dL   Total CHOL/HDL Ratio 4     NonHDL 183.25   Vitamin B12   Collection Time: 08/15/24 10:10 AM  Result Value Ref Range   Vitamin B-12 208 (L) 211 - 911 pg/mL      Assessment & Plan:   Problem List Items Addressed This Visit       Respiratory   Pulmonary nodule   Follow-up with low dose CT scan.         Digestive   Gastroesophageal reflux disease   Chronic, stable. She takes pantoprazole  40mg  daily and follows routinely with GI. Continue collaboration and recommendations from GI.       Relevant Medications   pantoprazole  (PROTONIX ) 40 MG tablet     Other   Tobacco use   She smokes three cigarettes per day with a significant pack-year history and a family history of lung cancer. Encourage smoking cessation and order CT of chest for lung cancer screening.      Relevant Medications   COVID-19 mRNA vaccine (SPIKEVAX) syringe   Other Relevant Orders   CT CHEST LUNG CANCER SCREENING LOW DOSE WO CONTRAST   Vitamin B12 deficiency   Check vitamin B12 today and treat based on results.       Relevant Orders   Vitamin B12 (Completed)   Pure hypercholesterolemia   Check CMP, CBC, lipid panel today and treat based on results      Relevant Orders   CBC with Differential/Platelet (Completed)   Comprehensive metabolic panel with GFR (Completed)   Lipid panel (Completed)   Other Visit Diagnoses       Routine general medical examination at a health care facility    -  Primary   Health maintenance reviewed and updated. Discused nutrition, exercise. Follow-up in 1 year.     Nasal polyp       Chronic nasal polyps are causing nasal narrowing, ear fullness, and throat symptoms. Refer to ENT for evaluation and management.   Relevant Orders   Ambulatory referral to ENT     Skin lesion       A new skin lesion is present with a history of tanning salon exposure. Keep dermatology appointment on December 22nd for evaluation.     Screen for colon cancer       Referral placed to GI for colonscopy   Relevant Orders    Ambulatory referral to Gastroenterology     Encounter for screening mammogram for malignant neoplasm of breast       mammogram ordered today   Relevant Orders   MM 3D SCREENING MAMMOGRAM BILATERAL BREAST     Immunization due       Covid 19 vaccine RX printed for patient   Relevant Medications   COVID-19 mRNA vaccine (SPIKEVAX) syringe       Follow up plan: Return in about 1 year (around 08/15/2025) for CPE.   LABORATORY TESTING:  - Pap smear: not applicable  IMMUNIZATIONS:   - Tdap: Tetanus vaccination status reviewed: last tetanus booster within 10 years. - Influenza: Declined - Pneumovax: Not applicable - Prevnar: Declined - HPV: Not applicable - Shingrix  vaccine: Declined  SCREENING: -Mammogram: Ordered today  - Colonoscopy: ordered today  - Bone Density: Not applicable   PATIENT COUNSELING:   Advised to take 1 mg of folate supplement per day if capable of pregnancy.   Sexuality: Discussed sexually transmitted  diseases, partner selection, use of condoms, avoidance of unintended pregnancy  and contraceptive alternatives.   Advised to avoid cigarette smoking.  I discussed with the patient that most people either abstain from alcohol or drink within safe limits (<=14/week and <=4 drinks/occasion for males, <=7/weeks and <= 3 drinks/occasion for females) and that the risk for alcohol disorders and other health effects rises proportionally with the number of drinks per week and how often a drinker exceeds daily limits.  Discussed cessation/primary prevention of drug use and availability of treatment for abuse.   Diet: Encouraged to adjust caloric intake to maintain  or achieve ideal body weight, to reduce intake of dietary saturated fat and total fat, to limit sodium intake by avoiding high sodium foods and not adding table salt, and to maintain adequate dietary potassium and calcium preferably from fresh fruits, vegetables, and low-fat dairy products.    stressed the  importance of regular exercise  Injury prevention: Discussed safety belts, safety helmets, smoke detector, smoking near bedding or upholstery.   Dental health: Discussed importance of regular tooth brushing, flossing, and dental visits.    NEXT PREVENTATIVE PHYSICAL DUE IN 1 YEAR. Return in about 1 year (around 08/15/2025) for CPE.  Lanice Folden A [employer]

## 2024-08-15 NOTE — Assessment & Plan Note (Signed)
 Check vitamin B12 today and treat based on results.

## 2024-08-15 NOTE — Assessment & Plan Note (Signed)
 Check CMP, CBC, lipid panel today and treat based on results.

## 2024-08-15 NOTE — Assessment & Plan Note (Signed)
 She smokes three cigarettes per day with a significant pack-year history and a family history of lung cancer. Encourage smoking cessation and order CT of chest for lung cancer screening.

## 2024-08-15 NOTE — Assessment & Plan Note (Signed)
 Follow-up with low dose CT scan.

## 2024-08-18 ENCOUNTER — Telehealth: Admitting: Physician Assistant

## 2024-08-18 DIAGNOSIS — M545 Low back pain, unspecified: Secondary | ICD-10-CM

## 2024-08-18 MED ORDER — CYCLOBENZAPRINE HCL 10 MG PO TABS
10.0000 mg | ORAL_TABLET | Freq: Three times a day (TID) | ORAL | 0 refills | Status: AC | PRN
Start: 2024-08-18 — End: ?

## 2024-08-18 NOTE — Progress Notes (Signed)
We are sorry that you are not feeling well.  Here is how we plan to help!  Based on what you have shared with me it looks like you mostly have acute back pain.  Acute back pain is defined as musculoskeletal pain that can resolve in 1-3 weeks with conservative treatment.  I have prescribed Flexeril 10 mg every eight hours as needed which is a muscle relaxer  Some patients experience stomach irritation or in increased heartburn with anti-inflammatory drugs.  Please keep in mind that muscle relaxer's can cause fatigue and should not be taken while at work or driving.  Back pain is very common.  The pain often gets better over time.  The cause of back pain is usually not dangerous.  Most people can learn to manage their back pain on their own.  Home Care Stay active.  Start with short walks on flat ground if you can.  Try to walk farther each day. Do not sit, drive or stand in one place for more than 30 minutes.  Do not stay in bed. Do not avoid exercise or work.  Activity can help your back heal faster. Be careful when you bend or lift an object.  Bend at your knees, keep the object close to you, and do not twist. Sleep on a firm mattress.  Lie on your side, and bend your knees.  If you lie on your back, put a pillow under your knees. Only take medicines as told by your doctor. Put ice on the injured area. Put ice in a plastic bag Place a towel between your skin and the bag Leave the ice on for 15-20 minutes, 3-4 times a day for the first 2-3 days. 210 After that, you can switch between ice and heat packs. Ask your doctor about back exercises or massage. Avoid feeling anxious or stressed.  Find good ways to deal with stress, such as exercise.  Get Help Right Way If: Your pain does not go away with rest or medicine. Your pain does not go away in 1 week. You have new problems. You do not feel well. The pain spreads into your legs. You cannot control when you poop (bowel movement) or pee  (urinate) You feel sick to your stomach (nauseous) or throw up (vomit) You have belly (abdominal) pain. You feel like you may pass out (faint). If you develop a fever.  Make Sure you: Understand these instructions. Will watch your condition Will get help right away if you are not doing well or get worse.  Your e-visit answers were reviewed by a board certified advanced clinical practitioner to complete your personal care plan.  Depending on the condition, your plan could have included both over the counter or prescription medications.  If there is a problem please reply  once you have received a response from your provider.  Your safety is important to [place].  If you have drug allergies check your prescription carefully.    You can use MyChart to ask questions about today's visit, request a non-urgent call back, or ask for a work or school excuse for 24 hours related to this e-Visit. If it has been greater than 24 hours you will need to follow up with your provider, or enter a new e-Visit to address those concerns.  You will get an e-mail in the next two days asking about your experience.  I hope that your e-visit has been valuable and will speed your recovery. Thank you for using e-visits.  I have spent 5 minutes in review of e-visit questionnaire, review and updating patient chart, medical decision making and response to patient.   Tylene Fantasia Ward, PA-C

## 2024-08-19 DIAGNOSIS — Z419 Encounter for procedure for purposes other than remedying health state, unspecified: Secondary | ICD-10-CM | POA: Diagnosis not present

## 2024-08-20 ENCOUNTER — Telehealth: Payer: Self-pay

## 2024-08-20 NOTE — Telephone Encounter (Signed)
 Copied from CRM [id number]. Topic: Clinical - Request for Lab/Test Order >> Aug 20, 2024  2:43 PM [name] wrote: Reason for CRM: Patient would like orders placed for B12 injection and a phone call back to schedule injection once orders are placed.   CB#[phone]  Deborah Peterson, can she get scheduled for a B12 injections?

## 2024-08-21 NOTE — Telephone Encounter (Signed)
 Contacted patient via phone to get scheduled for B-12 injections. Unable to get in contact with patient Deborah Peterson

## 2024-08-23 ENCOUNTER — Encounter: Payer: Self-pay | Admitting: Nurse Practitioner

## 2024-08-24 ENCOUNTER — Telehealth: Payer: Self-pay | Admitting: Pediatrics

## 2024-08-24 ENCOUNTER — Ambulatory Visit

## 2024-08-24 ENCOUNTER — Other Ambulatory Visit

## 2024-08-24 DIAGNOSIS — E538 Deficiency of other specified B group vitamins: Secondary | ICD-10-CM | POA: Diagnosis not present

## 2024-08-24 MED ORDER — CYANOCOBALAMIN 1000 MCG/ML IJ SOLN
1000.0000 ug | Freq: Once | INTRAMUSCULAR | Status: AC
  Administered 2024-08-24: 1000 ug via INTRAMUSCULAR

## 2024-08-24 NOTE — Progress Notes (Signed)
 Patient is in office today for a nurse visit for B12 Injection. Patient Injection was given in the  Left deltoid. Patient tolerated injection well.

## 2024-08-24 NOTE — Telephone Encounter (Signed)
 Good Afternoon Dr. Suzann,  DOD PM 08/24/2024  Patient calling wanting to schedule colonoscopy due to a referral we received, patient states her last one was done back in 2017-2018 with Dr.Mann but after reviewing chart looks like it was in 2021. Patient would like to transfer care to us  due to Dr.Mann being retired. Records can be found in chart review media.  Please advise for future scheduling  Thank you

## 2024-08-27 ENCOUNTER — Other Ambulatory Visit: Payer: Self-pay

## 2024-08-27 ENCOUNTER — Encounter

## 2024-08-27 NOTE — Telephone Encounter (Signed)
 Called patient to schedule office visit but no answer so left voice mail to call back.  Thank you

## 2024-08-28 ENCOUNTER — Ambulatory Visit
Admission: RE | Admit: 2024-08-28 | Discharge: 2024-08-28 | Disposition: A | Discharge status: Home / Self Care | Source: Ambulatory Visit | Attending: Nurse Practitioner | Admitting: Nurse Practitioner

## 2024-08-28 DIAGNOSIS — Z720 Tobacco use: Secondary | ICD-10-CM

## 2024-08-28 DIAGNOSIS — F17210 Nicotine dependence, cigarettes, uncomplicated: Secondary | ICD-10-CM | POA: Diagnosis not present

## 2024-08-30 ENCOUNTER — Encounter: Payer: Self-pay | Admitting: Pediatrics

## 2024-08-31 ENCOUNTER — Telehealth: Payer: Self-pay

## 2024-08-31 NOTE — Telephone Encounter (Signed)
 Copied from CRM #[id number]. Topic: General - Other >> Aug 31, 2024  9:45 AM [place] J wrote: Reason for CRM: Imaging calling to ensure that results from today were received. I was able to confirm that they were received as well as the doctor's name who signed off.  For any questions, please call: [phone]

## 2024-08-31 NOTE — Addendum Note (Signed)
 Addended by: Florice Hindle A on: 08/31/2024 10:13 AM   Modules accepted: Orders

## 2024-09-03 NOTE — Telephone Encounter (Signed)
 Pt has seen MyChart message from [employer]. Referral is being sent to Pulmonology per Lauren. Pt did not respond will any other questions. Closing encounter.

## 2024-09-04 DIAGNOSIS — F900 Attention-deficit hyperactivity disorder, predominantly inattentive type: Secondary | ICD-10-CM | POA: Diagnosis not present

## 2024-09-04 DIAGNOSIS — F413 Other mixed anxiety disorders: Secondary | ICD-10-CM | POA: Diagnosis not present

## 2024-09-07 ENCOUNTER — Ambulatory Visit: Payer: Self-pay

## 2024-09-07 NOTE — Telephone Encounter (Signed)
 FYI Only or Action Required?: FYI only for provider: Advised UC.  Patient was last seen in primary care on 08/15/2024 by Nedra Tinnie LABOR, NP.  Called Nurse Triage reporting Dysuria.  Symptoms began today.  Interventions attempted: Nothing.  Symptoms are: stable.  Triage Disposition: See Physician Within 24 Hours  Patient/caregiver understands and will follow disposition?: Yes Reason for Disposition  Age > 50 years  Answer Assessment - Initial Assessment Questions Advised UC due to it being a Friday, do not want patient to wait over the weekend. Patient stated she cannot go to UC as she is working. Advised they are open later than doctors offices, she stated well that's not going to help, I will just deal with it then and hung up,.  1. SEVERITY: How bad is the pain?  (e.g., Scale 1-10; mild, moderate, or severe)     On fire when urinating and just sitting here  2. FREQUENCY: How many times have you had painful urination today?      1  3. PATTERN: Is pain present every time you urinate or just sometimes?      Once, ten minutes ago  4. ONSET: When did the painful urination start?      10 minutes ago  5. FEVER: Do you have a fever? If Yes, ask: What is your temperature, how was it measured, and when did it start?     Unsure, states its not high despite having chills  6. PAST UTI: Have you had a urine infection before? If Yes, ask: When was the last time? and What happened that time?      Used to get them really bad when younger, its been years since last one  7. CAUSE: What do you think is causing the painful urination?  (e.g., UTI, scratch, Herpes sore)     UTI  8. OTHER SYMPTOMS: Do you have any other symptoms? (e.g., blood in urine, flank pain, genital sores, urgency, vaginal discharge)     Overly cold today  Protocols used: Urination Pain - Female-A-AH  Copied from CRM #[id number]. Topic: Clinical - Red Word Triage >> Sep 07, 2024  4:44 PM Alfonso ORN wrote: Red Word that prompted transfer to Nurse Triage: pt experiencing really strong uti symptoms : extreme burning/pain with urination

## 2024-09-10 NOTE — Telephone Encounter (Signed)
 Noted. Patient scheduled for an appointment for tomorrow, Nov. 3rd.

## 2024-09-11 ENCOUNTER — Ambulatory Visit (INDEPENDENT_AMBULATORY_CARE_PROVIDER_SITE_OTHER): Admitting: Nurse Practitioner

## 2024-09-11 ENCOUNTER — Encounter: Payer: Self-pay | Admitting: Nurse Practitioner

## 2024-09-11 VITALS — BP 104/60 | HR 85 | Temp 97.1°F | Ht 61.0 in | Wt 109.4 lb

## 2024-09-11 DIAGNOSIS — N3000 Acute cystitis without hematuria: Secondary | ICD-10-CM | POA: Diagnosis not present

## 2024-09-11 DIAGNOSIS — R300 Dysuria: Secondary | ICD-10-CM

## 2024-09-11 LAB — POCT URINALYSIS DIPSTICK
Bilirubin, UA: POSITIVE
Blood, UA: NEGATIVE
Glucose, UA: NEGATIVE — AB
Ketones, UA: NEGATIVE
Nitrite, UA: POSITIVE
Protein, UA: NEGATIVE
Spec Grav, UA: <=1.005 — AB (ref 1.010–1.025)
Urobilinogen, UA: 0.2 U/dL
pH, UA: 6 (ref 5.0–8.0)

## 2024-09-11 MED ORDER — PHENAZOPYRIDINE HCL 100 MG PO TABS: 100.0000 mg | ORAL_TABLET | Freq: Three times a day (TID) | ORAL | 0 refills | Status: DC | PRN

## 2024-09-11 MED ORDER — NITROFURANTOIN MONOHYD MACRO 100 MG PO CAPS
100.0000 mg | ORAL_CAPSULE | Freq: Two times a day (BID) | ORAL | 0 refills | Status: AC
Start: 2024-09-11 — End: ?

## 2024-09-11 NOTE — Patient Instructions (Signed)
 It was great to see you!  Keep drinking plenty of fluids  Start macrobid twice a day for 5 days   Start pyridium 1 tablet 3 times a day as needed for burning with urination   Let's follow-up if symptoms worsen or any concerns   Take care,  Tinnie Harada, NP

## 2024-09-11 NOTE — Progress Notes (Signed)
 Acute Office Visit  Subjective:     Patient ID: Deborah Peterson, female    DOB: [birth year], 58 y.o.   MRN: [id number]  Chief Complaint  Patient presents with   Pain with urination    Since Friday and taking AZO    HPI Discussed the use of AI scribe software for clinical note transcription with the patient, who gave verbal consent to proceed.  History of Present Illness   Deborah Peterson is a 58 year old female who presents with symptoms of a urinary tract infection.  She experiences dysuria and occasional increased urinary frequency. She feels generally unwell with nausea and body aches, though she is unsure about having a fever. She has right-sided back pain and abdominal soreness. She has been using Azo and increasing water intake, but symptoms have worsened.      ROS See pertinent positives and negatives per HPI.     Objective:    BP 104/60 (BP Location: Left Arm, Patient Position: Sitting, Cuff Size: Small)   Pulse 85   Temp (!) 97.1 F (36.2 C)   Ht 5' 1 (1.549 m)   Wt 109 lb 6.4 oz (49.6 kg)   SpO2 98%   BMI 20.67 kg/m    Physical Exam Vitals and nursing note reviewed.  Constitutional:      General: She is not in acute distress.    Appearance: Normal appearance.  HENT:     Head: Normocephalic.  Eyes:     Conjunctiva/sclera: Conjunctivae normal.  Pulmonary:     Effort: Pulmonary effort is normal.  Abdominal:     Palpations: Abdomen is soft.     Tenderness: There is abdominal tenderness (pelvis area). There is no right CVA tenderness or left CVA tenderness.  Musculoskeletal:     Cervical back: Normal range of motion.  Skin:    General: Skin is warm.  Neurological:     General: No focal deficit present.     Mental Status: She is alert and oriented to person, place, and time.  Psychiatric:        Mood and Affect: Mood normal.        Behavior: Behavior normal.        Thought Content: Thought content normal.        Judgment: Judgment normal.      Results for orders placed or performed in visit on 09/11/24  POCT urinalysis dipstick  Result Value Ref Range   Color, UA     Clarity, UA     Glucose, UA Negative (A) Negative   Bilirubin, UA Positive    Ketones, UA Negative    Spec Grav, UA <=1.005 (A) 1.010 - 1.025   Blood, UA Negative    pH, UA 6.0 5.0 - 8.0   Protein, UA Negative Negative   Urobilinogen, UA 0.2 0.2 or 1.0 E.U./dL   Nitrite, UA Positive    Leukocytes, UA Moderate (2+) (A) Negative   Appearance     Odor          Assessment & Plan:   Problem List Items Addressed This Visit   None Visit Diagnoses       Acute cystitis without hematuria    -  Primary   Start macrobid 100mg  BID x5 days. Encourage fluids. Add on urine culture.   Relevant Orders   Urine Culture     Dysuria       U/A shows 2+ leukocytes, will treat for UTI. Start pyridium 100mg  TID prn dysurai.  Relevant Orders   POCT urinalysis dipstick (Completed)       Meds ordered this encounter  Medications   nitrofurantoin, macrocrystal-monohydrate, (MACROBID) 100 MG capsule    Sig: Take 1 capsule (100 mg total) by mouth 2 (two) times daily.    Dispense:  10 capsule    Refill:  0   phenazopyridine (PYRIDIUM) 100 MG tablet    Sig: Take 1 tablet (100 mg total) by mouth 3 (three) times daily as needed for pain.    Dispense:  10 tablet    Refill:  0    Return if symptoms worsen or fail to improve.  Tinnie DELENA Harada, NP

## 2024-09-13 ENCOUNTER — Ambulatory Visit: Payer: Self-pay | Admitting: Nurse Practitioner

## 2024-09-13 LAB — URINE CULTURE
MICRO NUMBER:: 17188500
SPECIMEN QUALITY:: ADEQUATE

## 2024-09-18 ENCOUNTER — Encounter

## 2024-09-19 ENCOUNTER — Encounter (INDEPENDENT_AMBULATORY_CARE_PROVIDER_SITE_OTHER): Payer: Self-pay

## 2024-09-19 ENCOUNTER — Ambulatory Visit (INDEPENDENT_AMBULATORY_CARE_PROVIDER_SITE_OTHER)

## 2024-09-19 VITALS — BP 124/69 | HR 87 | Temp 98.0°F | Wt 107.0 lb

## 2024-09-19 DIAGNOSIS — R490 Dysphonia: Secondary | ICD-10-CM | POA: Diagnosis not present

## 2024-09-19 DIAGNOSIS — J34829 Nasal valve collapse, unspecified: Secondary | ICD-10-CM

## 2024-09-19 DIAGNOSIS — J3489 Other specified disorders of nose and nasal sinuses: Secondary | ICD-10-CM

## 2024-09-19 DIAGNOSIS — K219 Gastro-esophageal reflux disease without esophagitis: Secondary | ICD-10-CM

## 2024-09-19 DIAGNOSIS — H6121 Impacted cerumen, right ear: Secondary | ICD-10-CM | POA: Diagnosis not present

## 2024-09-19 DIAGNOSIS — J381 Polyp of vocal cord and larynx: Secondary | ICD-10-CM

## 2024-09-19 MED ORDER — DEBROX 6.5 % OT SOLN: 5.0000 [drp] | Freq: Two times a day (BID) | OTIC | 0 refills | Status: AC

## 2024-09-19 NOTE — Progress Notes (Addendum)
 HPI:   Discussed the use of AI scribe software for clinical note transcription with the patient, who gave verbal consent to proceed.  History of Present Illness Deborah Peterson is a 58 year old female who presents with chronic right ear itching and nasal obstruction.  She has experienced persistent itching in her right ear for the past two to three years, with occasional wetness and crusting every few days. There is no history of ear infections or ear surgeries. She occasionally uses Q-tips.  She experiences nasal obstruction and mouth breathing, feeling as though her nasal passages are blocked. 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 There is no history of nasal surgeries. She states that she has used breathe right strips in the past which have significantly relieved the obstruction she is experiencing.   She experiences frequent choking episodes, approximately three times a week, which can occur even when not eating. Her father has commented that she might not be swallowing her saliva properly. There is no throat pain but frequent throat clearing.   She has a history of smoking since age [age] and currently smokes three cigarettes a day, down from two packs a day. She is actively trying to quit smoking. She does note that since decreasing her daily cigarette use she has had improvement in her voice and vocal quality.   She takes pantoprazole  40 mg daily for the past eight years for reflux symptoms.    PMH/Meds/All/SocHx/FamHx/ROS: Past Medical History:  Diagnosis Date   Alcoholism (HCC)    Allergy 1985   Anxiety 1970   GERD (gastroesophageal reflux disease) 2017   Ulcer 2019   gastric ulcers   Past Surgical History:  Procedure Laterality Date   ABDOMINAL HYSTERECTOMY     CESAREAN SECTION     TUBAL LIGATION  1996   No family history of bleeding disorders, wound healing  problems or difficulty with anesthesia.  Social Connections: Socially Isolated (09/09/2024)   Social Connection and Isolation Panel    Frequency of Communication with Friends and Family: More than three times a week    Frequency of Social Gatherings with Friends and Family: Once a week    Attends Religious Services: Never    Database Administrator or Organizations: No    Attends Engineer, Structural: Not on file    Marital Status: Separated    Current Outpatient Medications:    acetaminophen  (TYLENOL ) 500 MG tablet, Take 500 mg by mouth every 8 (eight) hours as needed for moderate pain., Disp: , Rfl:    albuterol  (VENTOLIN  HFA) 108 (90 Base) MCG/ACT inhaler, Inhale 2 puffs into the lungs every 6 (six) hours as needed for wheezing or shortness of breath., Disp: 8 g, Rfl: 0   amphetamine-dextroamphetamine (ADDERALL) 10 MG tablet, Take 10 mg by mouth 2 (two) times daily., Disp: , Rfl:    carbamide peroxide (DEBROX) 6.5 % OTIC solution, Place 5 drops into the right ear 2 (two) times daily for 7 days., Disp: 3.5 mL, Rfl: 0   folic acid  (FOLVITE ) 1 MG tablet, Take 1 tablet (1 mg total) by mouth daily., Disp: 30 tablet, Rfl: 0   Multiple Vitamin (MULTIVITAMIN WITH MINERALS) TABS tablet, Take 1 tablet by mouth daily., Disp: , Rfl:    pantoprazole  (PROTONIX ) 40 MG tablet, Take 1 tablet (40 mg total) by mouth daily., Disp: 90 tablet, Rfl: 1   simethicone  (GAS-X) 80 MG  chewable tablet, Chew 1 tablet (80 mg total) by mouth every 6 (six) hours as needed for flatulence., Disp: 30 tablet, Rfl: 0   cyclobenzaprine  (FLEXERIL ) 10 MG tablet, Take 1 tablet (10 mg total) by mouth 3 (three) times daily as needed for muscle spasms. (Patient not taking: Reported on 09/19/2024), Disp: 30 tablet, Rfl: 0   nitrofurantoin, macrocrystal-monohydrate, (MACROBID) 100 MG capsule, Take 1 capsule (100 mg total) by mouth 2 (two) times daily. (Patient not taking: Reported on 09/19/2024), Disp: 10 capsule, Rfl: 0    phenazopyridine (PYRIDIUM) 100 MG tablet, Take 1 tablet (100 mg total) by mouth 3 (three) times daily as needed for pain. (Patient not taking: Reported on 09/19/2024), Disp: 10 tablet, Rfl: 0 A complete ROS was performed with pertinent positives/negatives noted in the HPI. The remainder of the ROS are negative.   Physical Exam:  BP 124/69 (BP Location: Left Arm, Patient Position: Sitting, Cuff Size: Normal)   Pulse 87   Temp 98 F (36.7 C)   Wt 107 lb (48.5 kg)   SpO2 98%   BMI 20.22 kg/m  General: Well developed, well nourished. No acute distress. Voice hoarse Head/Face: Normocephalic. No sinus tenderness. Facial nerve intact and equal bilaterally. No facial lacerations. Eyes: PERRL, no scleral icterus or conjunctival hemorrhage. EOMI. Ears: No gross deformity. Normal external canal. Tympanic membrane in tact on left. Unable to visualize TM on right due to cerumen impaction Hearing: Normal speech reception.  Nose: No gross deformity or lesions. No purulent discharge. No turbinate hypertrophy. Mouth/Oropharynx: Lips without any lesions.  No mucosal lesions within the oropharynx. No tonsillar enlargement, exudate, or lesions. Pharyngeal walls symmetrical. Uvula midline. Tongue midline without lesions. Larynx: See TFL if applicable Nasopharynx: See TFL if applicable Neck: Trachea midline. No masses. No thyromegaly or nodules palpated. No crepitus. Lymphatic: No lymphadenopathy in the neck. Respiratory: No stridor or distress. Room air. Cardiovascular: Regular rate and rhythm. Extremities: No edema or cyanosis. Warm and well-perfused. Skin: No scars or lesions on face or neck. Neurologic: CN II-XII grossly intact. Moving all extremities without gross abnormality. Other:  Independent Review of Additional Tests or Records: None Procedures: Flexible Fiberoptic Laryngoscopy Procedure Note  Date of procedure 09/19/2024 Pre-Op Diagnosis: hoarseness    Post Op Diagnosis: same Procedure:  Flexible Fiberoptic Laryngoscopy CPT 68424 - Mod 25 Surgeon: Adah Malkin, D.O. 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 It was passed along the floor of the nasal cavity to the nasopharynx. Mucosal inflammation of the nasal cavity was noted. Torus tubarius was patent and the Fossa of Rosenmller was identified. The scope was then flexed caudally and advanced slowly through the nasopharynx, passed through the oropharynx, and down into the hypopharynx. The patient's oro- and nasopharynx were unremarkable with no signs of any gross lesions, edema, masses, or bleeding.  The base of tongue was visualized and no mass, ulceration or lesion was appreciated. The epiglottis did not demonstrate any mass, ulceration or lesion. The vallecula was also assessed with no mass, ulceration or lesion. The patient had good glottic closure upon phonation and no signs of aspiration or  pooling of secretions. The patient was asked to inspire and expire with the true vocal folds vibrating normally and without evidence of vocal fold dysfunction. The true vocal folds demonstrated significant Reinke's edema bilaterally spanning the anterior 2/3 of bilateral vocal cords. The false vocal cords, interarytenoid, AE folds, and arytenoids did not demonstrate any significant edema or erythema. Post-cricoid edema and interarytenoid pachydermia was noted. The patient was then asked to valsalva, and the pyriform sinuses were assessed which were unremarkable. The airway was patent and there was no  evidence of compromise. The scope was then slowly withdrawn from the patient. The patient tolerated the procedure well and there were no complications.  Disposition: Stable   Impression & Plans: Assessment & Plan Impacted cerumen, right ear Impacted cerumen in the right ear causing itching and hearing difficulties, likely exacerbated by Q-tip use. - Prescribed Debrox drops to soften ear wax. - Advised against using Q-tips.  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 Smoking cessation is crucial for improvement. - Continue smoking cessation efforts. - Prescribed famotidine 20 mg at night to reduce throat irritation. 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 Surgical options include functional rhinoplasty vs Latera implant - Provided information on nasal valve surgery and functional rhinoplasty. - Discussed surgical options  Gastroesophageal reflux disease (GERD), LPR GERD managed with pantoprazole  40 mg daily. Additional reflux control needed to reduce throat irritation. - Continue pantoprazole  40 mg daily. - Added famotidine 20 mg at night.  Follow-up in 1 week for cerumen removal.  Adah Malkin, DO [facility]

## 2024-09-20 ENCOUNTER — Encounter (INDEPENDENT_AMBULATORY_CARE_PROVIDER_SITE_OTHER): Payer: Self-pay

## 2024-09-26 ENCOUNTER — Ambulatory Visit: Payer: Self-pay

## 2024-09-26 ENCOUNTER — Ambulatory Visit (INDEPENDENT_AMBULATORY_CARE_PROVIDER_SITE_OTHER): Admitting: Family Medicine

## 2024-09-26 ENCOUNTER — Ambulatory Visit (INDEPENDENT_AMBULATORY_CARE_PROVIDER_SITE_OTHER)

## 2024-09-26 VITALS — BP 132/71 | HR 98 | Temp 98.7°F | Ht 61.0 in | Wt 108.0 lb

## 2024-09-26 DIAGNOSIS — Z006 Encounter for examination for normal comparison and control in clinical research program: Secondary | ICD-10-CM | POA: Diagnosis not present

## 2024-09-26 DIAGNOSIS — E538 Deficiency of other specified B group vitamins: Secondary | ICD-10-CM | POA: Diagnosis not present

## 2024-09-26 DIAGNOSIS — R1011 Right upper quadrant pain: Secondary | ICD-10-CM | POA: Diagnosis not present

## 2024-09-26 LAB — CBC WITH DIFFERENTIAL/PLATELET
Basophils Absolute: 0 K/uL (ref 0.0–0.1)
Basophils Relative: 0.5 % (ref 0.0–3.0)
Eosinophils Absolute: 0.2 K/uL (ref 0.0–0.7)
Eosinophils Relative: 2.4 % (ref 0.0–5.0)
HCT: 41.5 % (ref 36.0–46.0)
Hemoglobin: 13.8 g/dL (ref 12.0–15.0)
Lymphocytes Relative: 30.5 % (ref 12.0–46.0)
Lymphs Abs: 2.2 K/uL (ref 0.7–4.0)
MCHC: 33.2 g/dL (ref 30.0–36.0)
MCV: 101.5 fl — ABNORMAL HIGH (ref 78.0–100.0)
Monocytes Absolute: 0.7 K/uL (ref 0.1–1.0)
Monocytes Relative: 9.2 % (ref 3.0–12.0)
Neutro Abs: 4.2 K/uL (ref 1.4–7.7)
Neutrophils Relative %: 57.4 % (ref 43.0–77.0)
Platelets: 345 K/uL (ref 150.0–400.0)
RBC: 4.09 Mil/uL (ref 3.87–5.11)
RDW: 12.4 % (ref 11.5–15.5)
WBC: 7.3 K/uL (ref 4.0–10.5)

## 2024-09-26 LAB — COMPREHENSIVE METABOLIC PANEL WITH GFR
ALT: 11 U/L (ref 0–35)
AST: 18 U/L (ref 0–37)
Albumin: 4.1 g/dL (ref 3.5–5.2)
Alkaline Phosphatase: 84 U/L (ref 39–117)
BUN: 4 mg/dL — ABNORMAL LOW (ref 6–23)
CO2: 32 meq/L (ref 19–32)
Calcium: 9.2 mg/dL (ref 8.4–10.5)
Chloride: 98 meq/L (ref 96–112)
Creatinine, Ser: 0.53 mg/dL (ref 0.40–1.20)
GFR: 101.59 mL/min (ref >60.00)
Glucose, Bld: 85 mg/dL (ref 70–99)
Potassium: 4.1 meq/L (ref 3.5–5.1)
Sodium: 136 meq/L (ref 135–145)
Total Bilirubin: 0.3 mg/dL (ref 0.2–1.2)
Total Protein: 7.2 g/dL (ref 6.0–8.3)

## 2024-09-26 LAB — AMYLASE: Amylase: 52 U/L (ref 27–131)

## 2024-09-26 LAB — C-REACTIVE PROTEIN: CRP: <0.5 mg/dL (ref 0.5–20.0)

## 2024-09-26 LAB — LIPASE: Lipase: 19 U/L (ref 11.0–59.0)

## 2024-09-26 MED ORDER — CYANOCOBALAMIN 1000 MCG/ML IJ SOLN
1000.0000 ug | Freq: Once | INTRAMUSCULAR | Status: AC
  Administered 2024-09-26: 1000 ug via INTRAMUSCULAR

## 2024-09-26 MED ORDER — ONDANSETRON 4 MG PO TBDP: 4.0000 mg | ORAL_TABLET | Freq: Three times a day (TID) | ORAL | 0 refills | Status: AC | PRN

## 2024-09-26 MED ORDER — ACETAMINOPHEN 500 MG PO TABS: 500.0000 mg | ORAL_TABLET | Freq: Three times a day (TID) | ORAL | 0 refills | Status: AC | PRN

## 2024-09-26 NOTE — Patient Instructions (Addendum)
 It was very nice to see you today!  VISIT SUMMARY: You came in today with severe right upper abdominal pain that started late Sunday night and worsened by Monday morning. We discussed your symptoms, including a sore throat, mild fever, and recent diarrhea. 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  YOUR PLAN: RIGHT UPPER ABDOMINAL PAIN: You have severe pain in your right upper abdomen that started late Sunday night and worsened by Monday morning. The pain is sharp and severe, and it feels like being hit with a sledgehammer. -We ordered a right upper quadrant ultrasound to check for issues with your liver, gallbladder, or kidneys. -We ordered lab work including CMP, CBC, CRP, lipase, amylase, and a urine analysis with microscopic and reflex to culture. -We gave you Zofran  to help with nausea. -Stay hydrated and get plenty of rest. -Go to the emergency department if your symptoms get worse.  HISTORY OF ACUTE ALCOHOL-INDUCED PANCREATITIS: You have a history of acute alcohol-induced pancreatitis, but your current symptoms do not resemble previous episodes. -We included pancreatic markers (lipase, amylase) in your lab work to check your pancreas.   Return if symptoms worsen or fail to improve.   Take care, Arvella Hummer, MD, MS   PLEASE NOTE:  If you had any lab tests, please let us  know if you have not heard back within a few days. You may see your results on mychart before we have a chance to review them but we will give you a call once they are reviewed by us .   If we ordered any referrals today, please let us  know if you have not heard from their office within the next week.   If you had any urgent prescriptions sent in today, please check with the pharmacy within an hour of our visit to make sure the prescription was transmitted appropriately.   Please try these tips to maintain a healthy lifestyle:  Eat at least 3 REAL meals and 1-2 snacks per day.   Aim for no more than 5 hours between eating.  If you eat breakfast, please do so within one hour of getting up.   Each meal should contain half fruits/vegetables, one quarter protein, and one quarter carbs (no bigger than a computer mouse)  Cut down on sweet beverages. This includes juice, soda, and sweet tea.   Drink at least 1 glass of water with each meal and aim for at least 8 glasses per day  Exercise at least 150 minutes every week.

## 2024-09-26 NOTE — Progress Notes (Signed)
 Pt here for monthly B12 injection per PCP, Tinnie Harada. See 08/20/24 TE for provider orders.  B12 1000mcg given IM in the left deltoid and pt tolerated injection well.   Next B12 injection scheduled for: patients states she will call to schedule.

## 2024-09-26 NOTE — Telephone Encounter (Signed)
 This RN contacted pt. She stated she will have to call us  back. This RN advised that if pain is severe or she has vomiting or fever to go to the ED, but otherwise to call back and ask to speak to a nurse when she is available. Will place in call backs.  Copied from CRM [id number]. Topic: Clinical - Red Word Triage >> Sep 26, 2024 10:27 AM Rea BROCKS wrote: Red Word that prompted transfer to Nurse Triage:  Pain in right side and patient does not feel good  concerned about liver or gallbladder

## 2024-09-26 NOTE — Progress Notes (Signed)
 " Assessment & Plan   Assessment/Plan:   Assessment and Plan Assessment & Plan Right upper abdominal pain Acute right upper abdominal pain since late Sunday night, worsening by Monday morning. Pain is severe, described as feeling like being hit with a sledgehammer. No jaundice observed. History of acute alcohol-induced pancreatitis.  Denies alcohol use. Differential diagnosis includes pancreatitis, hepatobiliary dysfunction, cholecystitis, nephrolithiasis issues. No recent NSAID use. No fever or chills, but mild symptoms present. No recent illness exposure. Pain is not consistent with previous pancreatitis episodes. - Ordered right upper quadrant ultrasound - Ordered lab work including CMP, CBC, CRP, lipase, amylase, and UA with microscopic and reflex to culture - Administered Zofran  for nausea - Advised to stay hydrated and rest - Instructed to go to the emergency department if symptoms worsen          Medications Discontinued During This Encounter  Medication Reason   acetaminophen  (TYLENOL ) 500 MG tablet Reorder    Return if symptoms worsen or fail to improve.        Subjective:   Encounter date: 09/26/2024  Deborah Peterson is a 58 y.o. female who has Anxiety; Brain fog; Gastroesophageal reflux disease; Left hip pain; Chronic fatigue; Tobacco use; Concentration deficit; Acute pancreatitis; Hypokalemia; Leukocytosis; Pulmonary nodule; Alcohol abuse; Alcohol-induced chronic pancreatitis (HCC); Shortness of breath; Chronic midline low back pain with left-sided sciatica; Dizziness; Primary insomnia; Vitamin B12 deficiency; Pure hypercholesterolemia; and Essential tremor on their problem list..   She  has a past medical history of Alcoholism (HCC), Allergy (1985), Anxiety (1970), GERD (gastroesophageal reflux disease) (2017), and Ulcer (2019)..   She presents with chief complaint of Abdominal Pain (Pt c/o pain in upper right quad, afraid it is her gallbladder or liver.  Never had issues with neither, Very sharp pain with coughing sneezing talking or movement with arm.) .   Discussed the use of AI scribe software for clinical note transcription with the patient, who gave verbal consent to proceed.  History of Present Illness Deborah Peterson is a 58 year old female with acute alcohol-induced chronic pancreatitis who presents with right upper quadrant pain.  Right upper quadrant abdominal pain - Sharp, severe pain in the right upper quadrant since late Sunday night, worsening by Monday morning - Pain described as feeling like being hit with a sledgehammer - Exacerbated by sneezing and coughing - No recent injury or falls - Able to eat and drink; consumed white rice the previous night - Took one Tylenol  this morning for pain relief - Diarrhea the previous night - No nausea, vomiting, constipation, or blood in stool - Sore throat on the left side - Mild fever and chills - No recent exposure to sick individuals - No urinary bleeding or burning - Recently completed a course of Macrobid  for urinary tract infection  History of pancreatitis - History of acute alcohol-induced chronic pancreatitis - Current symptoms do not resemble previous pancreatitis episodes - No recent alcohol consumption  Functional status - 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 8 g 0  amphetamine-dextroamphetamine (ADDERALL) 10 MG tablet Take 10 mg by mouth 2 (two) times daily.     carbamide peroxide (DEBROX) 6.5 % OTIC solution Place 5 drops into the right ear  2 (two) times daily for 7 days. 3.5 mL 0   folic acid  (FOLVITE ) 1 MG tablet Take 1 tablet (1 mg total) by mouth daily. 30 tablet 0   Multiple Vitamin (MULTIVITAMIN WITH MINERALS) TABS tablet Take 1 tablet by mouth daily.     pantoprazole  (PROTONIX ) 40 MG tablet Take 1 tablet (40 mg total) by mouth daily. 90 tablet 1   simethicone  (GAS-X) 80 MG chewable tablet Chew 1 tablet (80 mg total) by mouth every 6 (six) hours as needed for flatulence. 30 tablet 0   acetaminophen  (TYLENOL ) 500 MG tablet Take 500 mg by mouth every 8 (eight) hours as needed for moderate pain.     cyclobenzaprine  (FLEXERIL ) 10 MG tablet Take 1 tablet (10 mg total) by mouth 3 (three) times daily as needed for muscle spasms. (Patient not taking: Reported on 09/27/2024) 30 tablet 0   nitrofurantoin , macrocrystal-monohydrate, (MACROBID ) 100 MG capsule Take 1 capsule (100 mg total) by mouth 2 (two) times daily. (Patient not taking: Reported on 09/27/2024) 10 capsule 0   phenazopyridine  (PYRIDIUM ) 100 MG tablet Take 1 tablet (100 mg total) by mouth 3 (three) times daily as needed for pain. (Patient not taking: Reported on 09/27/2024) 10 tablet 0   No facility-administered medications prior to visit.    Family History  Problem Relation Age of Onset   Breast cancer Mother    Cancer Mother        breast   Alzheimer's disease Mother    Heart disease Father    Breast cancer Maternal Aunt    Breast cancer Maternal Grandmother    Arthritis Maternal Grandmother    Vision loss Maternal Grandmother    Cancer Paternal Grandmother        ovarian   COPD Paternal Grandmother    Vision loss Paternal Grandmother    Heart disease Paternal Grandfather    ADD / ADHD Son     Social History   Socioeconomic History   Marital status: Divorced    Spouse name: Not on file   Number of children: Not on file   Years of education: Not on file   Highest education level: Associate degree: academic program  Occupational History   Not on file   Tobacco Use   Smoking status: Every Day    Current packs/day: 0.25    Average packs/day: 0.8 packs/day for 44.9 years (34.0 ttl pk-yrs)    Types: Cigarettes    Start date: 79   Smokeless tobacco: Never   Tobacco comments:    Down to 5 cigarette a day   Vaping Use   Vaping status: Never Used  Substance and Sexual Activity   Alcohol use: Not Currently    Alcohol/week: 6.0 standard drinks of alcohol    Types: 6 Cans of beer per week   Drug use: Never   Sexual activity: Not Currently    Birth control/protection: Abstinence, Surgical  Other Topics Concern   Not on file  Social History Narrative   Right handed    Lives in a one story home    Drinks Caffeine    Social Drivers of Health   Financial Resource Strain: Medium Risk (09/09/2024)   Overall Financial Resource Strain (CARDIA)    Difficulty of Paying Living Expenses: Somewhat hard  Food Insecurity: No Food Insecurity (09/09/2024)  Hunger Vital Sign    Worried About Running Out of Food in the Last Year: Never true    Ran Out of Food in the Last Year: Never true  Transportation Needs: No Transportation Needs (09/09/2024)   PRAPARE - Administrator, Civil Service (Medical): No    Lack of Transportation (Non-Medical): No  Physical Activity: Sufficiently Active (09/09/2024)   Exercise Vital Sign    Days of Exercise per Week: 7 days    Minutes of Exercise per Session: 150+ min  Stress: No Stress Concern Present (09/09/2024)   [employer] of Occupational Health - Occupational Stress Questionnaire    Feeling of Stress: Not at all  Social Connections: Socially Isolated (09/09/2024)   Social Connection and Isolation Panel    Frequency of Communication with Friends and Family: More than three times a week    Frequency of Social Gatherings with Friends and Family: Once a week    Attends Religious Services: Never    Database Administrator or Organizations: No    Attends Engineer, Structural: Not on file     Marital Status: Separated  Intimate Partner Violence: Not At Risk (02/05/2023)   Humiliation, Afraid, Rape, and Kick questionnaire    Fear of Current or Ex-Partner: No    Emotionally Abused: No    Physically Abused: No    Sexually Abused: No                                                                                                  Objective:  Physical Exam: BP 132/71   Pulse 98   Temp 98.7 F (37.1 C)   Ht 5' 1 (1.549 m)   Wt 108 lb (49 kg)   SpO2 100%   BMI 20.41 kg/m    Physical Exam GENERAL: Alert, cooperative, well developed, no acute distress. HEENT: Normocephalic, normal oropharynx, moist mucous membranes, no scleral icterus, tenderness in left throat on palpation. CHEST: Clear to auscultation bilaterally, no wheezes, rhonchi, or crackles. CARDIOVASCULAR: Normal heart rate and rhythm, S1 and S2 normal without murmurs. ABDOMEN: Soft, tenderness in right upper quadrant on palpation, non-distended, without organomegaly, normal bowel sounds. EXTREMITIES: No cyanosis or edema. NEUROLOGICAL: Cranial nerves grossly intact, moves all extremities without gross motor or sensory deficit.   Physical Exam  CT CHEST LUNG CANCER SCREENING LOW DOSE WO CONTRAST Result Date: 08/31/2024 CLINICAL DATA:  Asymptomatic current smoker with 90 pack-year smoking history. EXAM: CT CHEST WITHOUT CONTRAST LOW-DOSE FOR LUNG CANCER SCREENING TECHNIQUE: Multidetector CT imaging of the chest was performed following the standard protocol without IV contrast. RADIATION DOSE REDUCTION: This exam was performed according to the departmental dose-optimization program which includes automated exposure control, adjustment of the mA and/or kV according to patient size and/or use of iterative reconstruction technique. COMPARISON:  None Available. FINDINGS: Cardiovascular: Normal heart size. Aortic atherosclerosis and coronary artery calcifications. No pericardial effusion. Mediastinum/Nodes: No enlarged  mediastinal, hilar, or axillary lymph nodes. Thyroid  gland, trachea, and esophagus demonstrate no significant findings. Lungs/Pleura: Emphysema with diffuse bronchial wall thickening. Bilateral peripheral and lower lung zone predominant subpleural  reticulation is identified. Irregular subpleural nodule within periphery of the right upper lobe measures 10.3 mm. Within the left lower lobe there is a nodule measuring 7 mm which appears unchanged from 02/10/2023 likely reflecting a benign nodule. Additional small lung nodules are noted measuring up to 2.2 mm. Upper Abdomen: No acute findings within the imaged portions of the upper abdomen. New 1.4 cm lymph node within the gastrohepatic ligament. Musculoskeletal: No chest wall mass or suspicious bone lesions identified. IMPRESSION: 1. Lung-RADS 4A, suspicious. Follow up low-dose chest CT without contrast in 3 months (please use the following order, CT CHEST LCS NODULE FOLLOW-UP W/O CM) is recommended. Alternatively, PET may be considered when there is a solid component 8mm or larger. 2. Coronary artery calcifications Aortic Atherosclerosis (ICD10-I70.0) and Emphysema (ICD10-J43.9). These results will be called to the ordering clinician or representative by the Radiologist Assistant, and communication documented in the PACS or [employer]. Electronically Signed   By: Waddell Calk M.D.   On: 08/31/2024 09:08    Recent Results (from the past 2160 hours)  CBC with Differential/Platelet     Status: Abnormal   Collection Time: 08/15/24 10:10 AM  Result Value Ref Range   WBC 5.0 4.0 - 10.5 K/uL   RBC 3.68 (L) 3.87 - 5.11 Mil/uL   Hemoglobin 12.8 12.0 - 15.0 g/dL   HCT 62.5 63.9 - 53.9 %   MCV 101.7 (H) 78.0 - 100.0 fl   MCHC 34.2 30.0 - 36.0 g/dL   RDW 87.6 88.4 - 84.4 %   Platelets 338.0 150.0 - 400.0 K/uL   Neutrophils Relative % 50.8 43.0 - 77.0 %   Lymphocytes Relative 36.0 12.0 - 46.0 %   Monocytes Relative 8.8 3.0 - 12.0 %   Eosinophils Relative  3.3 0.0 - 5.0 %   Basophils Relative 1.1 0.0 - 3.0 %   Neutro Abs 2.6 1.4 - 7.7 K/uL   Lymphs Abs 1.8 0.7 - 4.0 K/uL   Monocytes Absolute 0.4 0.1 - 1.0 K/uL   Eosinophils Absolute 0.2 0.0 - 0.7 K/uL   Basophils Absolute 0.1 0.0 - 0.1 K/uL  Comprehensive metabolic panel with GFR     Status: None   Collection Time: 08/15/24 10:10 AM  Result Value Ref Range   Sodium 137 135 - 145 mEq/L   Potassium 3.9 3.5 - 5.1 mEq/L   Chloride 100 96 - 112 mEq/L   CO2 28 19 - 32 mEq/L   Glucose, Bld 88 70 - 99 mg/dL   BUN 6 6 - 23 mg/dL   Creatinine, Ser 9.49 0.40 - 1.20 mg/dL   Total Bilirubin 0.2 0.2 - 1.2 mg/dL   Alkaline Phosphatase 78 39 - 117 U/L   AST 15 0 - 37 U/L   ALT 7 0 - 35 U/L   Total Protein 6.5 6.0 - 8.3 g/dL   Albumin 3.9 3.5 - 5.2 g/dL   GFR 896.88 >39.99 mL/min    Comment: Calculated using the CKD-EPI Creatinine Equation (2021)   Calcium 9.1 8.4 - 10.5 mg/dL  Lipid panel     Status: Abnormal   Collection Time: 08/15/24 10:10 AM  Result Value Ref Range   Cholesterol 239 (H) 0 - 200 mg/dL    Comment: ATP III Classification       Desirable:  < 200 mg/dL               Borderline High:  200 - 239 mg/dL          High:  > =  240 mg/dL   Triglycerides 02.9 0.0 - 149.0 mg/dL    Comment: Normal:  <849 mg/dLBorderline High:  150 - 199 mg/dL   HDL 44.29 >60.99 mg/dL   VLDL 80.5 0.0 - 59.9 mg/dL   LDL Cholesterol 835 (H) 0 - 99 mg/dL   Total CHOL/HDL Ratio 4     Comment:                Men          Women1/2 Average Risk     3.4          3.3Average Risk          5.0          4.42X Average Risk          9.6          7.13X Average Risk          15.0          11.0                       NonHDL 183.25     Comment: NOTE:  Non-HDL goal should be 30 mg/dL higher than patient's LDL goal (i.e. LDL goal of < 70 mg/dL, would have non-HDL goal of < 100 mg/dL)  Vitamin B12     Status: Abnormal   Collection Time: 08/15/24 10:10 AM  Result Value Ref Range   Vitamin B-12 208 (L) 211 - 911 pg/mL  Urine  Culture     Status: Abnormal   Collection Time: 09/11/24 11:31 AM   Specimen: Urine  Result Value Ref Range   MICRO NUMBER: [id number]    SPECIMEN QUALITY: Adequate    Sample Source URINE    STATUS: FINAL    ISOLATE 1: Escherichia coli (A)     Comment: Greater than 100,000 CFU/mL of Escherichia coli      Susceptibility   Escherichia coli - URINE CULTURE, REFLEX    AMOX/CLAVULANIC <=2 Sensitive     AMPICILLIN/SULBACTAM <=2 Sensitive     CEFAZOLIN* <=1 Not Reportable      * For infections other than uncomplicated UTI caused by E. coli, K. pneumoniae or P. mirabilis: Cefazolin is resistant if MIC > or = 8 mcg/mL. (Distinguishing susceptible versus intermediate for isolates with MIC < or = 4 mcg/mL requires additional testing.) For uncomplicated UTI caused by E. coli, K. pneumoniae or P. mirabilis: Cefazolin is susceptible if MIC <32 mcg/mL and predicts susceptible to the oral agents cefaclor, cefdinir, cefpodoxime, cefprozil, cefuroxime, cephalexin and loracarbef.     CEFTAZIDIME <=0.5 Sensitive     CEFEPIME <=0.12 Sensitive     CEFTRIAXONE <=0.25 Sensitive     CIPROFLOXACIN <=0.06 Sensitive     LEVOFLOXACIN <=0.12 Sensitive     GENTAMICIN <=1 Sensitive     IMIPENEM <=0.25 Sensitive     MEROPENEM <=0.25 Sensitive     NITROFURANTOIN  <=16 Sensitive     PIP/TAZO <=4 Sensitive     TRIMETH/SULFA* <=20 Sensitive      * For infections other than uncomplicated UTI caused by E. coli, K. pneumoniae or P. mirabilis: Cefazolin is resistant if MIC > or = 8 mcg/mL. (Distinguishing susceptible versus intermediate for isolates with MIC < or = 4 mcg/mL requires additional testing.) For uncomplicated UTI caused by E. coli, K. pneumoniae or P. mirabilis: Cefazolin is susceptible if MIC <32 mcg/mL and predicts susceptible to the oral agents cefaclor, cefdinir, cefpodoxime, cefprozil, cefuroxime, cephalexin and loracarbef. Legend: S =  Susceptible  I = Intermediate R = Resistant  NS = Not  susceptible SDD = Susceptible Dose Dependent * = Not Tested  NR = Not Reported **NN = See Therapy Comments   POCT urinalysis dipstick     Status: Abnormal   Collection Time: 09/11/24 11:42 AM  Result Value Ref Range   Color, UA     Clarity, UA     Glucose, UA Negative (A) Negative   Bilirubin, UA Positive    Ketones, UA Negative    Spec Grav, UA <=1.005 (A) 1.010 - 1.025   Blood, UA Negative    pH, UA 6.0 5.0 - 8.0   Protein, UA Negative Negative   Urobilinogen, UA 0.2 0.2 or 1.0 E.U./dL   Nitrite, UA Positive    Leukocytes, UA Moderate (2+) (A) Negative   Appearance     Odor    Comp Met (CMET)     Status: Abnormal   Collection Time: 09/26/24  2:20 PM  Result Value Ref Range   Sodium 136 135 - 145 mEq/L   Potassium 4.1 3.5 - 5.1 mEq/L   Chloride 98 96 - 112 mEq/L   CO2 32 19 - 32 mEq/L    Comment: Elevated LDH levels may cause falsely increased CO2 results. If LDH is >2000 U/L, a positive bias of 12% is possible.   Glucose, Bld 85 70 - 99 mg/dL   BUN 4 (L) 6 - 23 mg/dL   Creatinine, Ser 9.46 0.40 - 1.20 mg/dL   Total Bilirubin 0.3 0.2 - 1.2 mg/dL   Alkaline Phosphatase 84 39 - 117 U/L   AST 18 0 - 37 U/L   ALT 11 0 - 35 U/L   Total Protein 7.2 6.0 - 8.3 g/dL   Albumin 4.1 3.5 - 5.2 g/dL   GFR 898.40 >39.99 mL/min    Comment: Calculated using the CKD-EPI Creatinine Equation (2021)   Calcium 9.2 8.4 - 10.5 mg/dL  CBC with Differential/Platelet     Status: Abnormal   Collection Time: 09/26/24  2:20 PM  Result Value Ref Range   WBC 7.3 4.0 - 10.5 K/uL   RBC 4.09 3.87 - 5.11 Mil/uL   Hemoglobin 13.8 12.0 - 15.0 g/dL   HCT 58.4 63.9 - 53.9 %   MCV 101.5 (H) 78.0 - 100.0 fl   MCHC 33.2 30.0 - 36.0 g/dL   RDW 87.5 88.4 - 84.4 %   Platelets 345.0 150.0 - 400.0 K/uL   Neutrophils Relative % 57.4 43.0 - 77.0 %   Lymphocytes Relative 30.5 12.0 - 46.0 %   Monocytes Relative 9.2 3.0 - 12.0 %   Eosinophils Relative 2.4 0.0 - 5.0 %   Basophils Relative 0.5 0.0 - 3.0 %    Neutro Abs 4.2 1.4 - 7.7 K/uL   Lymphs Abs 2.2 0.7 - 4.0 K/uL   Monocytes Absolute 0.7 0.1 - 1.0 K/uL   Eosinophils Absolute 0.2 0.0 - 0.7 K/uL   Basophils Absolute 0.0 0.0 - 0.1 K/uL  Urinalysis w microscopic + reflex cultur     Status: Abnormal   Collection Time: 09/26/24  2:20 PM   Specimen: Blood  Result Value Ref Range   Color, Urine YELLOW YELLOW   APPearance CLEAR CLEAR   Specific Gravity, Urine 1.007 1.001 - 1.035   pH 7.0 5.0 - 8.0   Glucose, UA NEGATIVE NEGATIVE   Bilirubin Urine NEGATIVE NEGATIVE   Ketones, ur NEGATIVE NEGATIVE   Hgb urine dipstick NEGATIVE NEGATIVE   Protein, ur NEGATIVE  NEGATIVE   Nitrites, Initial NEGATIVE NEGATIVE   Leukocyte Esterase NEGATIVE NEGATIVE   WBC, UA NONE SEEN 0 - 5 /HPF   RBC / HPF NONE SEEN 0 - 2 /HPF   Squamous Epithelial / HPF 6-10 (A) < OR = 5 /HPF   Bacteria, UA NONE SEEN NONE SEEN /HPF   Hyaline Cast NONE SEEN NONE SEEN /LPF   Note      Comment: This urine was analyzed for the presence of WBC,  RBC, bacteria, casts, and other formed elements.  Only those elements seen were reported. . .   C-reactive protein     Status: None   Collection Time: 09/26/24  2:20 PM  Result Value Ref Range   CRP <0.5 0.5 - 20.0 mg/dL  Lipase     Status: None   Collection Time: 09/26/24  2:20 PM  Result Value Ref Range   Lipase 19.0 11.0 - 59.0 U/L  Amylase     Status: None   Collection Time: 09/26/24  2:20 PM  Result Value Ref Range   Amylase 52 27 - 131 U/L  REFLEXIVE URINE CULTURE     Status: None   Collection Time: 09/26/24  2:20 PM  Result Value Ref Range   Reflexve Urine Culture      Comment: NO CULTURE INDICATED        Beverley Adine Hummer, MD, MS "

## 2024-09-26 NOTE — Telephone Encounter (Signed)
 FYI Only or Action Required?: FYI only for provider: Pt already seen today at [facility].  Patient was last seen in primary care on 09/26/2024 by Sebastian Beverley NOVAK, MD.  Called Nurse Triage reporting Abdominal Pain.  Symptoms began No triage.  Interventions attempted: Other:  No triage.  Symptoms are:  No triage.  Triage Disposition: No disposition on file.  Patient/caregiver understands and will follow disposition?:   Called pt back to complete triage. Pt reports she went in for a scheduled B12 injection today ather home office [facility] [employer]. She began telling them about her symptoms and they worked her in for an acute appt with Dr. Sebastian regarding her current symptoms. No triage needed at this time as pt has already seen a provider with plans to obtain bloodwork, UA and an ultrasound.

## 2024-09-27 ENCOUNTER — Encounter: Payer: Self-pay | Admitting: Nurse Practitioner

## 2024-09-27 ENCOUNTER — Ambulatory Visit (INDEPENDENT_AMBULATORY_CARE_PROVIDER_SITE_OTHER)

## 2024-09-27 ENCOUNTER — Ambulatory Visit: Payer: Self-pay | Admitting: Family Medicine

## 2024-09-27 VITALS — BP 128/72 | HR 86 | Wt 108.0 lb

## 2024-09-27 DIAGNOSIS — H903 Sensorineural hearing loss, bilateral: Secondary | ICD-10-CM

## 2024-09-27 DIAGNOSIS — J34829 Nasal valve collapse, unspecified: Secondary | ICD-10-CM | POA: Diagnosis not present

## 2024-09-27 DIAGNOSIS — H6121 Impacted cerumen, right ear: Secondary | ICD-10-CM | POA: Diagnosis not present

## 2024-09-27 LAB — URINALYSIS W MICROSCOPIC + REFLEX CULTURE
Bacteria, UA: NONE SEEN /HPF
Bilirubin Urine: NEGATIVE
Glucose, UA: NEGATIVE
Hgb urine dipstick: NEGATIVE
Hyaline Cast: NONE SEEN /LPF
Ketones, ur: NEGATIVE
Leukocyte Esterase: NEGATIVE
Nitrites, Initial: NEGATIVE
Protein, ur: NEGATIVE
RBC / HPF: NONE SEEN /HPF (ref 0–2)
Specific Gravity, Urine: 1.007 (ref 1.001–1.035)
WBC, UA: NONE SEEN /HPF (ref 0–5)
pH: 7 (ref 5.0–8.0)

## 2024-09-27 LAB — NO CULTURE INDICATED

## 2024-09-27 NOTE — Progress Notes (Signed)
 Last read by Georgian S Brosious at 3:42PM on 09/27/2024.

## 2024-09-27 NOTE — Progress Notes (Signed)
 HPI:   Patient presents in follow-up regarding her right ear. She states that it feels plugged and she feels like she is in tunnel. No complaints regarding the left ear. Denies any drainage from the ear. Has been using Debrox.   PMH/Meds/All/SocHx/FamHx/ROS: Past Medical History:  Diagnosis Date   Alcoholism (HCC)    Allergy 1985   Anxiety 1970   GERD (gastroesophageal reflux disease) 2017   Ulcer 2019   gastric ulcers   Past Surgical History:  Procedure Laterality Date   ABDOMINAL HYSTERECTOMY     CESAREAN SECTION     TUBAL LIGATION  1996   No family history of bleeding disorders, wound healing problems or difficulty with anesthesia.  Social Connections: Socially Isolated (09/09/2024)   Social Connection and Isolation Panel    Frequency of Communication with Friends and Family: More than three times a week    Frequency of Social Gatherings with Friends and Family: Once a week    Attends Religious Services: Never    Database Administrator or Organizations: No    Attends Engineer, Structural: Not on file    Marital Status: Separated    Current Outpatient Medications:    acetaminophen  (TYLENOL ) 500 MG tablet, Take 1 tablet (500 mg total) by mouth every 8 (eight) hours as needed for moderate pain (pain score 4-6)., Disp: 30 tablet, Rfl: 0   albuterol  (VENTOLIN  HFA) 108 (90 Base) MCG/ACT inhaler, Inhale 2 puffs into the lungs every 6 (six) hours as needed for wheezing or shortness of breath., Disp: 8 g, Rfl: 0   amphetamine-dextroamphetamine (ADDERALL) 10 MG tablet, Take 10 mg by mouth 2 (two) times daily., Disp: , Rfl:    folic acid  (FOLVITE ) 1 MG tablet, Take 1 tablet (1 mg total) by mouth daily., Disp: 30 tablet, Rfl: 0   Multiple Vitamin (MULTIVITAMIN WITH MINERALS) TABS tablet, Take 1 tablet by mouth daily., Disp: , Rfl:    ondansetron  (ZOFRAN -ODT) 4 MG disintegrating tablet, Take 1 tablet (4 mg total) by mouth every 8 (eight) hours as needed for nausea or vomiting.,  Disp: 20 tablet, Rfl: 0   pantoprazole  (PROTONIX ) 40 MG tablet, Take 1 tablet (40 mg total) by mouth daily., Disp: 90 tablet, Rfl: 1   simethicone  (GAS-X) 80 MG chewable tablet, Chew 1 tablet (80 mg total) by mouth every 6 (six) hours as needed for flatulence., Disp: 30 tablet, Rfl: 0   cyclobenzaprine  (FLEXERIL ) 10 MG tablet, Take 1 tablet (10 mg total) by mouth 3 (three) times daily as needed for muscle spasms. (Patient not taking: Reported on 09/27/2024), Disp: 30 tablet, Rfl: 0   nitrofurantoin, macrocrystal-monohydrate, (MACROBID) 100 MG capsule, Take 1 capsule (100 mg total) by mouth 2 (two) times daily. (Patient not taking: Reported on 09/27/2024), Disp: 10 capsule, Rfl: 0   phenazopyridine (PYRIDIUM) 100 MG tablet, Take 1 tablet (100 mg total) by mouth 3 (three) times daily as needed for pain. (Patient not taking: Reported on 09/27/2024), Disp: 10 tablet, Rfl: 0 A complete ROS was performed with pertinent positives/negatives noted in the HPI. The remainder of the ROS are negative.   Physical Exam:  BP 128/72 (BP Location: Left Arm, Patient Position: Sitting, Cuff Size: Normal)   Pulse 86   Wt 108 lb (49 kg)   SpO2 98%   BMI 20.41 kg/m  General: Well developed, well nourished. No acute distress. Voice interval improvement in hoarseness Head/Face: Normocephalic. No sinus tenderness. Facial nerve intact and equal bilaterally. No facial lacerations. Eyes: PERRL, no scleral  icterus or conjunctival hemorrhage. EOMI. Ears: No gross deformity. Normal external canal. Cerumen impaction right EAC. Tympanic membrane in tact bilaterally Hearing: Normal speech reception.  Nose: No gross deformity or lesions. Bilateral static internal nasal valve collapse.  Mouth/Oropharynx: Lips without any lesions.  No mucosal lesions within the oropharynx. No tonsillar enlargement, exudate, or lesions. Pharyngeal walls symmetrical. Uvula midline. Tongue midline without lesions. Larynx: See TFL if  applicable Nasopharynx: See TFL if applicable Neck: Trachea midline. No masses. No thyromegaly or nodules palpated. No crepitus. Lymphatic: No lymphadenopathy in the neck. Respiratory: No stridor or distress. Room air. Cardiovascular: Regular rate and rhythm. Extremities: No edema or cyanosis. Warm and well-perfused. Skin: No scars or lesions on face or neck. Neurologic: CN II-XII grossly intact. Moving all extremities without gross abnormality. Other:   Independent Review of Additional Tests or Records: None  Procedures: Procedure: bilateral ear microscopy and cerumen removal using microscope (CPT [id number]) - Mod 25 Pre-procedure diagnosis: unilateral cerumen impaction right external auditory canal Post-procedure diagnosis: same Indication: bilateral cerumen impaction; given patient's otologic complaints and history as well as for improved and comprehensive examination of external ear and tympanic membrane, bilateral otologic examination using microscope was performed and impacted cerumen removed  Procedure: Patient was placed semi-recumbent. Both ear canals were examined using the microscope with findings above. Cerumen removed from the right external auditory canal using suction and currette with improvement in EAC examination and patency. Left: EAC was patent. TM was intact . Middle ear was aerated. Drainage: none Right: EAC was patent. TM was intact . Middle ear was aerated . Drainage: none Patient tolerated the procedure well.    Impression & Plans:  Impacted cerumen, right ear Impacted cerumen in the right ear causing itching and hearing difficulties, likely exacerbated by Q-tip use. - Cerumen removed under microscope with instrumentation - Advised against using Q-tips. 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 Surgical options include functional rhinoplasty vs Latera  implant - Provided information on nasal valve surgery and functional rhinoplasty. - Patient states that she would prefer to proceed with a more permanent option and has requested to proceed with functional septorhinoplasty   Follow-up after audiogram. Will schedule for functional septorhinoplasty with Dr. Anice.   Adah Malkin, DO [facility]

## 2024-09-27 NOTE — Telephone Encounter (Signed)
 Patient notified of message and will go to the ED.

## 2024-09-28 ENCOUNTER — Ambulatory Visit (INDEPENDENT_AMBULATORY_CARE_PROVIDER_SITE_OTHER)

## 2024-09-28 ENCOUNTER — Ambulatory Visit (HOSPITAL_COMMUNITY)
Admission: RE | Admit: 2024-09-28 | Discharge: 2024-09-28 | Disposition: A | Discharge status: Home / Self Care | Source: Ambulatory Visit | Attending: Family Medicine | Admitting: Family Medicine

## 2024-09-28 ENCOUNTER — Telehealth: Payer: Self-pay

## 2024-09-28 DIAGNOSIS — R109 Unspecified abdominal pain: Secondary | ICD-10-CM | POA: Diagnosis not present

## 2024-09-28 DIAGNOSIS — R1011 Right upper quadrant pain: Secondary | ICD-10-CM

## 2024-09-28 DIAGNOSIS — R079 Chest pain, unspecified: Secondary | ICD-10-CM | POA: Diagnosis not present

## 2024-09-28 NOTE — Telephone Encounter (Signed)
 Copied from CRM #[id number]. Topic: Clinical - Request for Lab/Test Order >> Sep 28, 2024 11:13 AM Laymon HERO wrote: Reason for CRM: Patient asking for an order to get an xray of her ribs. Wanting a nurse to contact her

## 2024-09-28 NOTE — Telephone Encounter (Signed)
 An xray has been ordered. Patient may come to office to get xray. If symptoms are continuing, recommend she be seen for OV. If worsening, UC. If severe, ED.

## 2024-09-28 NOTE — Telephone Encounter (Signed)
 Copied from CRM #[id number]. Topic: General - Other >> Sep 28, 2024  2:23 PM Alfonso HERO wrote: Reason for CRM: patient coming to the office for xrays

## 2024-10-01 NOTE — Telephone Encounter (Signed)
 Patient already got her X-Ray done on 09/28/2024

## 2024-10-03 ENCOUNTER — Ambulatory Visit: Admitting: Emergency Medicine

## 2024-10-03 ENCOUNTER — Encounter: Payer: Self-pay | Admitting: Emergency Medicine

## 2024-10-03 VITALS — BP 126/60 | HR 96 | Temp 98.7°F | Ht 61.0 in | Wt 108.6 lb

## 2024-10-03 DIAGNOSIS — J449 Chronic obstructive pulmonary disease, unspecified: Secondary | ICD-10-CM | POA: Diagnosis not present

## 2024-10-03 DIAGNOSIS — F17210 Nicotine dependence, cigarettes, uncomplicated: Secondary | ICD-10-CM

## 2024-10-03 DIAGNOSIS — J849 Interstitial pulmonary disease, unspecified: Secondary | ICD-10-CM | POA: Diagnosis not present

## 2024-10-03 DIAGNOSIS — Z720 Tobacco use: Secondary | ICD-10-CM

## 2024-10-03 NOTE — Patient Instructions (Signed)
  VISIT SUMMARY: Today, we discussed the findings from your recent lung cancer screening CT scan, which showed some changes in your lungs, including nodules and other areas of concern. We also reviewed your history of smoking and exposure to various chemicals, as well as your current symptoms and overall health.  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 We have ordered a high-resolution CT scan of your chest in January to get a clearer picture and determine the next steps.  -CHRONIC OBSTRUCTIVE PULMONARY DISEASE (COPD): COPD is a chronic lung disease that makes it hard to breathe. We have ordered pulmonary function tests to assess the severity of your condition. Please keep your albuterol  inhaler available for use as needed.  -TOBACCO USE DISORDER: This refers to the dependence on tobacco. We discussed various strategies to help you quit smoking, including using nicotine  patches, setting a quit date, and managing withdrawal symptoms. Please inform others of your quit date and use behavioral strategies to handle cravings.  INSTRUCTIONS: Please schedule a high-resolution CT scan of your chest in January and follow up with pulmonary function tests as ordered. Keep your albuterol  inhaler with you and use it as needed. Set a quit date for smoking and start using nicotine  patches on that day. Inform your friends and family about your quit date for support.

## 2024-10-03 NOTE — Progress Notes (Signed)
 Subjective:    Patient ID: Deborah Peterson, female    DOB: Feb 23, 1966, 58 y.o.   MRN: [id number]  HPI Discussed the use of AI scribe software for clinical note transcription with the patient, who gave verbal consent to proceed.  History of Present Illness Deborah Peterson is a 58 year old female who presents with nodular disease seen on lung cancer screening CT scan. She was referred for evaluation of nodular disease seen on lung cancer screening CT scan.  A lung cancer screening CT scan on August 28, 2024, revealed emphysematous changes with diffuse bronchial wall thickening, bilateral peripheral subpleural reticulation, and mild interstitial changes. A 10 mm area of thickened irregularity in the peripheral right upper lobe, almost nodular in nature, was noted. Additionally, a 7 mm nodule in the left lower lobe was unchanged compared to a CT abdomen from February 10, 2023. A new 1.4 cm lymph node was reported at the gastrohepatic ligament.  She has a significant history of tobacco use, with an 80+ pack-year history, currently smoking three cigarettes a day, down from two packs a day. She started smoking at age 39. She has attempted to quit smoking and has ordered nicotine  patches to aid in cessation.  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 She has also had past exposure to asbestos during home remodeling. She denies any history of cancer or positive TB tests. Family history includes breast cancer, ovarian cancer, and an aunt who passed away from lung cancer despite never smoking.  She is very active and only experiences shortness of breath with extreme exertion. She occasionally uses albuterol , which was prescribed, but often forgets it. She has a cough, mostly when sitting, and sometimes brings up clear mucus.  She has a history of peptic ulcer disease and GERD. She also reports a sensation of inflammation in  her body for years, though no formal diagnosis of an inflammatory disease has been made. She has not had any significant water damage or mold exposure in her current living situation, but she lives in a rental home built in [place] with plaster walls and popcorn ceilings.    CT scan of chest from 08/28/2024 was personally reviewed and interpreted by me, compared to a CT scan of the abdomen done 02/10/23.   Results RADIOLOGY Chest CT: Emphysematous change with diffuse bronchial wall thickening, bilateral peripheral subpleural reticulation, mild interstitial change, 10 mm irregularity in the peripheral right upper lobe, 7 mm nodule in the left lower lobe unchanged, 1.4 cm lymph node at the gastrohepatic ligament. (08/28/2024)    Review of Systems As per HPI  Past Medical History:  Diagnosis Date   Alcoholism (HCC)    Allergy 1985   Anxiety 1970   GERD (gastroesophageal reflux disease) 2017   Ulcer 2019   gastric ulcers    Family History  Problem Relation Age of Onset   Breast cancer Mother    Cancer Mother        breast   Alzheimer's disease Mother    Heart disease Father    Breast cancer Maternal Aunt    Breast cancer Maternal Grandmother    Arthritis Maternal Grandmother    Vision loss Maternal Grandmother    Cancer Paternal Grandmother        ovarian   COPD Paternal Grandmother    Vision loss Paternal Grandmother    Heart disease Paternal Grandfather    ADD / ADHD Son  Social History   Socioeconomic History   Marital status: Divorced    Spouse name: Not on file   Number of children: Not on file   Years of education: Not on file   Highest education level: Associate degree: academic program  Occupational History   Not on file  Tobacco Use   Smoking status: Every Day    Current packs/day: 0.25    Average packs/day: 0.8 packs/day for 44.9 years (34.0 ttl pk-yrs)    Types: Cigarettes    Start date: 28   Smokeless tobacco: Never   Tobacco comments:    Down to  3 cigs per day 10/03/2024   Vaping Use   Vaping status: Never Used  Substance and Sexual Activity   Alcohol use: Not Currently    Alcohol/week: 6.0 standard drinks of alcohol    Types: 6 Cans of beer per week   Drug use: Never   Sexual activity: Not Currently    Birth control/protection: Abstinence, Surgical  Other Topics Concern   Not on file  Social History Narrative   Right handed    Lives in a one story home    Drinks Caffeine    Social Drivers of Health   Financial Resource Strain: Medium Risk (09/09/2024)   Overall Financial Resource Strain (CARDIA)    Difficulty of Paying Living Expenses: Somewhat hard  Food Insecurity: No Food Insecurity (09/09/2024)   Hunger Vital Sign    Worried About Running Out of Food in the Last Year: Never true    Ran Out of Food in the Last Year: Never true  Transportation Needs: No Transportation Needs (09/09/2024)   PRAPARE - Administrator, Civil Service (Medical): No    Lack of Transportation (Non-Medical): No  Physical Activity: Sufficiently Active (09/09/2024)   Exercise Vital Sign    Days of Exercise per Week: 7 days    Minutes of Exercise per Session: 150+ min  Stress: No Stress Concern Present (09/09/2024)   [employer] of Occupational Health - Occupational Stress Questionnaire    Feeling of Stress: Not at all  Social Connections: Socially Isolated (09/09/2024)   Social Connection and Isolation Panel    Frequency of Communication with Friends and Family: More than three times a week    Frequency of Social Gatherings with Friends and Family: Once a week    Attends Religious Services: Never    Database Administrator or Organizations: No    Attends Engineer, Structural: Not on file    Marital Status: Separated  Intimate Partner Violence: Not At Risk (02/05/2023)   Humiliation, Afraid, Rape, and Kick questionnaire    Fear of Current or Ex-Partner: No    Emotionally Abused: No    Physically Abused: No     Sexually Abused: No    Allergies  Allergen Reactions   Erythromycin Anaphylaxis   Motrin [Ibuprofen]     unknown   Penicillins     unknown   Tramadol     unknown   Ciprofloxacin Itching    Current Outpatient Medications on File Prior to Visit  Medication Sig Dispense Refill   acetaminophen  (TYLENOL ) 500 MG tablet Take 1 tablet (500 mg total) by mouth every 8 (eight) hours as needed for moderate pain (pain score 4-6). 30 tablet 0   albuterol  (VENTOLIN  HFA) 108 (90 Base) MCG/ACT inhaler Inhale 2 puffs into the lungs every 6 (six) hours as needed for wheezing or shortness of breath. 8 g 0   amphetamine-dextroamphetamine (ADDERALL)  10 MG tablet Take 10 mg by mouth 2 (two) times daily.     folic acid  (FOLVITE ) 1 MG tablet Take 1 tablet (1 mg total) by mouth daily. 30 tablet 0   Multiple Vitamin (MULTIVITAMIN WITH MINERALS) TABS tablet Take 1 tablet by mouth daily.     pantoprazole  (PROTONIX ) 40 MG tablet Take 1 tablet (40 mg total) by mouth daily. 90 tablet 1   simethicone  (GAS-X) 80 MG chewable tablet Chew 1 tablet (80 mg total) by mouth every 6 (six) hours as needed for flatulence. 30 tablet 0   cyclobenzaprine  (FLEXERIL ) 10 MG tablet Take 1 tablet (10 mg total) by mouth 3 (three) times daily as needed for muscle spasms. (Patient not taking: Reported on 10/03/2024) 30 tablet 0   nitrofurantoin , macrocrystal-monohydrate, (MACROBID ) 100 MG capsule Take 1 capsule (100 mg total) by mouth 2 (two) times daily. (Patient not taking: Reported on 09/27/2024) 10 capsule 0   ondansetron  (ZOFRAN -ODT) 4 MG disintegrating tablet Take 1 tablet (4 mg total) by mouth every 8 (eight) hours as needed for nausea or vomiting. (Patient not taking: Reported on 10/03/2024) 20 tablet 0   phenazopyridine  (PYRIDIUM ) 100 MG tablet Take 1 tablet (100 mg total) by mouth 3 (three) times daily as needed for pain. (Patient not taking: Reported on 09/27/2024) 10 tablet 0   No current facility-administered medications on file  prior to visit.       Objective:    Vitals:   10/03/24 1510  BP: 126/60  Pulse: 96  Temp: 98.7 F (37.1 C)  SpO2: 99%  Weight: 108 lb 9.6 oz (49.3 kg)  Height: 5' 1 (1.549 m)   Physical Exam Gen: Pleasant, thin woman, in no distress,  normal affect  ENT: No lesions,  mouth clear,  oropharynx clear, no postnasal drip  Neck: No JVD, no stridor  Lungs: No use of accessory muscles, distant, no crackles or wheezing on normal respiration, no wheeze on forced expiration  Cardiovascular: RRR, heart sounds normal, no murmur or gallops, no peripheral edema  Musculoskeletal: No deformities, no cyanosis or clubbing. Tenderness R costal margin  Neuro: alert, awake, non focal  Skin: Warm, no lesions or rashes       Assessment & Plan:   Assessment & Plan ILD (interstitial lung disease) (HCC)  Chronic obstructive pulmonary disease, unspecified COPD type (HCC)  Tobacco use [Z72.0]   Assessment and Plan Assessment & Plan Pulmonary nodular disease with interstitial and emphysematous changes CT scan shows emphysematous changes, bronchial wall thickening, subpleural reticulation, and interstitial changes. Noted 10 mm irregularity in right upper lobe and 7 mm nodule in left lower lobe. New 1.4 cm lymph node at gastrohepatic ligament. Differential includes old inflammation, scarring, asbestos or chemical exposure. - Ordered high-resolution CT scan of chest in January. - Will evaluate CT results for further workup.  Chronic obstructive pulmonary disease (COPD) Imaging suggests COPD. Symptoms minimal, occasional cough with clear sputum. No inhaler use, albuterol  available. - Ordered pulmonary function tests to assess severity. - Advised to keep albuterol  available as needed.  Tobacco use disorder Long smoking history, reduced to three cigarettes per day. Discussed cessation strategies, nicotine  patches, behavioral modifications, and withdrawal management. - Encouraged nicotine   patches on quit day. - Advised on behavioral strategies for cravings. - Recommended setting a quit date and informing others. - Discussed withdrawal symptoms management.   Return in about 8 weeks (around 11/29/2024) for With Dr. Shelah, To review CT scan of the chest.  Smoking/Tobacco Cessation Counseling Deborah Peterson  is a current user of tobacco or nicotine  products. She is ready to quit at this time. Counseling provided today addressed the risks of continued use and the benefits of cessation. Discussed tobacco/nicotine  use history, readiness to quit, and evidence-based treatment options including behavioral strategies, support resources, and pharmacologic therapies. Provided encouragement and educational materials on steps and resources to quit smoking. Patient questions were addressed, and follow-up recommended for continued support. Total time spent on counseling: 13 minutes.      Lamar Chris, MD, PhD 10/03/2024, 3:51 PM [facility] [phone] or if no answer before 7:00PM call [phone] For any issues after 7:00PM please call eLink [phone]

## 2024-10-08 ENCOUNTER — Ambulatory Visit
Admission: RE | Admit: 2024-10-08 | Discharge: 2024-10-08 | Disposition: A | Source: Ambulatory Visit | Attending: Nurse Practitioner | Admitting: Nurse Practitioner

## 2024-10-08 DIAGNOSIS — Z1231 Encounter for screening mammogram for malignant neoplasm of breast: Secondary | ICD-10-CM

## 2024-10-10 ENCOUNTER — Ambulatory Visit: Payer: Self-pay | Admitting: Family Medicine

## 2024-10-15 ENCOUNTER — Ambulatory Visit: Admitting: Nurse Practitioner

## 2024-10-19 DIAGNOSIS — Z419 Encounter for procedure for purposes other than remedying health state, unspecified: Secondary | ICD-10-CM | POA: Diagnosis not present

## 2024-10-24 ENCOUNTER — Encounter: Payer: Self-pay | Admitting: Emergency Medicine

## 2024-10-24 ENCOUNTER — Ambulatory Visit: Admitting: Pediatrics

## 2024-10-25 ENCOUNTER — Ambulatory Visit (INDEPENDENT_AMBULATORY_CARE_PROVIDER_SITE_OTHER)

## 2024-10-25 ENCOUNTER — Encounter (INDEPENDENT_AMBULATORY_CARE_PROVIDER_SITE_OTHER): Payer: Self-pay

## 2024-10-25 ENCOUNTER — Telehealth (INDEPENDENT_AMBULATORY_CARE_PROVIDER_SITE_OTHER): Payer: Self-pay

## 2024-10-25 VITALS — BP 114/69 | HR 88 | Temp 98.0°F | Wt 108.0 lb

## 2024-10-25 DIAGNOSIS — J381 Polyp of vocal cord and larynx: Secondary | ICD-10-CM | POA: Diagnosis not present

## 2024-10-25 DIAGNOSIS — J3489 Other specified disorders of nose and nasal sinuses: Secondary | ICD-10-CM

## 2024-10-25 DIAGNOSIS — J34829 Nasal valve collapse, unspecified: Secondary | ICD-10-CM | POA: Diagnosis not present

## 2024-10-25 DIAGNOSIS — K219 Gastro-esophageal reflux disease without esophagitis: Secondary | ICD-10-CM | POA: Diagnosis not present

## 2024-10-25 DIAGNOSIS — R490 Dysphonia: Secondary | ICD-10-CM | POA: Diagnosis not present

## 2024-10-25 DIAGNOSIS — H608X3 Other otitis externa, bilateral: Secondary | ICD-10-CM | POA: Diagnosis not present

## 2024-10-25 DIAGNOSIS — Z720 Tobacco use: Secondary | ICD-10-CM | POA: Diagnosis not present

## 2024-10-25 DIAGNOSIS — J342 Deviated nasal septum: Secondary | ICD-10-CM

## 2024-10-25 DIAGNOSIS — R0981 Nasal congestion: Secondary | ICD-10-CM

## 2024-10-25 MED ORDER — FAMOTIDINE 20 MG PO TABS: 20.0000 mg | ORAL_TABLET | Freq: Every day | ORAL | 2 refills | Status: AC

## 2024-10-25 MED ORDER — FLUOCINOLONE ACETONIDE 0.01 % OT OIL: 5.0000 [drp] | TOPICAL_OIL | Freq: Two times a day (BID) | OTIC | 0 refills | Status: AC

## 2024-10-25 MED ORDER — NICOTINE 7 MG/24HR TD PT24: MEDICATED_PATCH | TRANSDERMAL | 0 refills | Status: AC

## 2024-10-25 MED ORDER — FLUTICASONE PROPIONATE 50 MCG/ACT NA SUSP: 2.0000 | Freq: Every day | NASAL | 6 refills | Status: AC

## 2024-10-25 MED ORDER — DOXYCYCLINE HYCLATE 100 MG PO TABS: 100.0000 mg | ORAL_TABLET | Freq: Two times a day (BID) | ORAL | 0 refills | Status: AC

## 2024-10-25 NOTE — Telephone Encounter (Signed)
 Called and spoke to the patient. Revisited the purpose of the medications. Explained the miscommunication regarding the surgery scheduled for the end of the month and why it was cancelled.

## 2024-10-25 NOTE — Telephone Encounter (Signed)
 Patient called in today after her office visit. She left voice mail message at 1:48pm. She stated she has thought of questions about surgery since she left the office today. Would Dr. Anice please give her a call. I am sending message to Dr. Anice since he has left office for surgery.

## 2024-10-25 NOTE — Progress Notes (Signed)
 HPI:   Discussed the use of AI scribe software for clinical note transcription with the patient, who gave verbal consent to proceed.  History of Present Illness Deborah Peterson is a 58 year old female who presents with chronic right ear itching and nasal obstruction.  She has experienced persistent itching in her right ear for the past two to three years, with occasional wetness and crusting every few days. There is no history of ear infections or ear surgeries. She occasionally uses Q-tips.  She experiences nasal obstruction and mouth breathing, feeling as though her nasal passages are blocked. 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 There is no history of nasal surgeries. She states that she has used breathe right strips in the past which have significantly relieved the obstruction she is experiencing.   She experiences frequent choking episodes, approximately three times a week, which can occur even when not eating. Her father has commented that she might not be swallowing her saliva properly. There is no throat pain but frequent throat clearing.   She has a history of smoking since age [age] and currently smokes three cigarettes a day, down from two packs a day. She is actively trying to quit smoking. She does note that since decreasing her daily cigarette use she has had improvement in her voice and vocal quality.   She takes pantoprazole  40 mg daily for the past eight years for reflux symptoms.   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 - Persistent mouth breathing and nasal stuffiness - Clear nasal discharge without purulence - Xerostomia and worsening dental issues attributed to chronic mouth breathing - No significant seasonal variation, but transient worsening with heavy pollen exposure - No prior allergy testing - Occasional use of saline nasal irrigation - Hyposmia and hypogeusia, associated with frequent exposure to cleaning chemicals - No frank nasal collapse, but external nasal narrowing observed with deep inspiration - Remote nasal trauma in childhood without confirmed fracture  Dysphonia and laryngeal symptoms - Chronic hoarseness and low-pitched voice, described as sounding like a trucker - Known bilateral vocal cord polyps - No participation in voice therapy - Chronic throat irritation and persistent globus sensation for several years  Otologic symptoms - Chronic right ear pruritus and intermittent sensation of fluid - Persistent dryness of the right ear - No hearing loss - No formal audiologic evaluation - Previously used Q-tips, now discontinued - No use of topical otic drops  Gastroesophageal reflux symptoms and management - Chronic throat irritation and globus sensation - Currently taking pantoprazole  for reflux - Recently instructed to add famotidine , not yet started  Nicotine  dependence - Currently smokes approximately three cigarettes per day - Previously smoked up to two packs daily - Actively attempting cessation without nicotine  replacement therapy  PMH/Meds/All/SocHx/FamHx/ROS: Past Medical History:  Diagnosis Date   Alcoholism (HCC)    Allergy 1985   Anxiety 1970   GERD (gastroesophageal reflux disease) 2017   Ulcer 2019   gastric ulcers   Past Surgical History:  Procedure Laterality Date   ABDOMINAL HYSTERECTOMY  CESAREAN SECTION     TUBAL LIGATION  1996   No family history of bleeding disorders, wound healing problems  or difficulty with anesthesia.  Social Connections: Socially Isolated (09/09/2024)   Social Connection and Isolation Panel    Frequency of Communication with Friends and Family: More than three times a week    Frequency of Social Gatherings with Friends and Family: Once a week    Attends Religious Services: Never    Database Administrator or Organizations: No    Attends Engineer, Structural: Not on file    Marital Status: Separated    Current Outpatient Medications:    acetaminophen  (TYLENOL ) 500 MG tablet, Take 1 tablet (500 mg total) by mouth every 8 (eight) hours as needed for moderate pain (pain score 4-6)., Disp: 30 tablet, Rfl: 0   albuterol  (VENTOLIN  HFA) 108 (90 Base) MCG/ACT inhaler, Inhale 2 puffs into the lungs every 6 (six) hours as needed for wheezing or shortness of breath., Disp: 8 g, Rfl: 0   amphetamine-dextroamphetamine (ADDERALL) 10 MG tablet, Take 10 mg by mouth 2 (two) times daily., Disp: , Rfl:    folic acid  (FOLVITE ) 1 MG tablet, Take 1 tablet (1 mg total) by mouth daily., Disp: 30 tablet, Rfl: 0   Multiple Vitamin (MULTIVITAMIN WITH MINERALS) TABS tablet, Take 1 tablet by mouth daily., Disp: , Rfl:    pantoprazole  (PROTONIX ) 40 MG tablet, Take 1 tablet (40 mg total) by mouth daily., Disp: 90 tablet, Rfl: 1   simethicone  (GAS-X) 80 MG chewable tablet, Chew 1 tablet (80 mg total) by mouth every 6 (six) hours as needed for flatulence., Disp: 30 tablet, Rfl: 0   cyclobenzaprine  (FLEXERIL ) 10 MG tablet, Take 1 tablet (10 mg total) by mouth 3 (three) times daily as needed for muscle spasms. (Patient not taking: Reported on 10/25/2024), Disp: 30 tablet, Rfl: 0   ondansetron  (ZOFRAN -ODT) 4 MG disintegrating tablet, Take 1 tablet (4 mg total) by mouth every 8 (eight) hours as needed for nausea or vomiting. (Patient not taking: Reported on 10/25/2024), Disp: 20 tablet, Rfl: 0 A complete ROS was performed with pertinent positives/negatives noted in the HPI. The remainder of  the ROS are negative.   Physical Exam:  BP 114/69 (BP Location: Left Arm, Patient Position: Sitting, Cuff Size: Normal)   Pulse 88   Temp 98 F (36.7 C)   Wt 108 lb (49 kg)   SpO2 98%   BMI 20.41 kg/m  General: Well developed, well nourished. No acute distress. Voice hoarse Head/Face: Normocephalic. No sinus tenderness. Facial nerve intact and equal bilaterally. No facial lacerations. Eyes: PERRL, no scleral icterus or conjunctival hemorrhage. EOMI. Ears: No gross deformity. Normal external canal. Tympanic membrane in tact on left. Unable to visualize TM on right due to cerumen impaction Hearing: Normal speech reception.  Nose: No gross deformity or lesions. No purulent discharge. No turbinate hypertrophy. Mouth/Oropharynx: Lips without any lesions.  No mucosal lesions within the oropharynx. No tonsillar enlargement, exudate, or lesions. Pharyngeal walls symmetrical. Uvula midline. Tongue midline without lesions. Larynx: See TFL if applicable Nasopharynx: See TFL if applicable Neck: Trachea midline. No masses. No thyromegaly or nodules palpated. No crepitus. Lymphatic: No lymphadenopathy in the neck. Respiratory: No stridor or distress. Room air. Cardiovascular: Regular rate and rhythm. Extremities: No edema or cyanosis. Warm and well-perfused. Skin: No scars or lesions on face or neck. Neurologic: CN II-XII grossly intact. Moving all extremities without gross abnormality. Other:  Independent Review of Additional Tests or Records:  Procedure Note Pre-procedure diagnosis:  Dysphonia, lpr Post-procedure diagnosis: Same Procedure: Transnasal Fiberoptic Laryngoscopy, CPT 31575 - Mod 25 Indication: dysphonia and hx of polyps, hx of tobacco use Complications: None apparent EBL: 0 mL  The procedure was undertaken to further evaluate the patient's complaint of dysphonia, tobacco use and throat clearing, with mirror exam inadequate for appropriate examination due to gag reflex and poor  patient tolerance  Procedure:  Patient was identified as correct patient. Verbal consent was obtained. The nose was sprayed with oxymetazoline and 4% lidocaine. The The flexible laryngoscope was passed through the nose to view the nasal cavity, pharynx (oropharynx, hypopharynx) and larynx.  The larynx was examined at rest and during multiple phonatory tasks. Documentation was obtained and reviewed with patient. The scope was removed. The patient tolerated the procedure well.  Findings: The nasal cavity and nasopharynx did not reveal any masses or lesions, mucosa appeared to be without obvious lesions. The tongue base, pharyngeal walls, piriform sinuses, vallecula, epiglottis and postcricoid region are normal in appearance EXCEPT: significant reinkes polyposis bilaterally,. The visualized portion of the subglottis and proximal trachea is widely patent. The vocal folds are mobile bilaterally. There are no lesions on the free edge of the vocal folds nor elsewhere in the larynx worrisome for malignancy.    Electronically signed by: Penne Croak, DO 10/25/2024 2:47 PM   Given the patient's symptoms and incomplete visualization of critical sinonasal areas with anterior rhinoscopy, a separately performed diagnostic nasal endoscopy procedure is indicated for a complete rhinologic evaluation per American Rhinologic Society recommendations (https://[email or web address]-rhinologic.org/position-statements)  I personally ordered, reviewed and interpreted the following with the patient today  Procedure Note Diagnostic Nasal Endoscopy CPT CODE -- 68768 - Mod 25  Prior to initiating any procedures, risks/benefits/alternatives were explained to the patient and verbal consent obtained.  Pre-procedure diagnosis: Concern for obstruction Post-procedure diagnosis: same Indication: See pre-procedure diagnosis and physical exam above Complications: None apparent EBL: 0 mL Anesthesia: Lidocaine 4% and topical decongestant  was topically sprayed in each nasal cavity  Description of Procedure:  Patient was identified. A flexible fiberoptic endoscope was utilized to evaluate the sinonasal cavities, mucosa, sinus ostia and turbinates and septum.  Overall, signs of mucosal inflammation are noted.  Also noted are  Left DNS. Also dynamic collapse on inspiration R>L.  Right Middle meatus: edema Right SE Recess: clear Left MM: edema Left SE Recess: clear Photodocumentation was obtained. Assessment & Plan Nasal valve collapse Causing nasal obstruction and mouth breathing. Surgical options include functional rhinoplasty vs Latera implant - Provided information on nasal valve surgery and functional rhinoplasty. 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 No purulence or significant mucosal edema. Surgical intervention deferred in favor of medical management. - Performed nasal endoscopy to assess anatomy and patency. - Prescribed fluticasone  nasal spray. - Prescribed a course of oral antibiotics (non-penicillin due to allergy). - Instructed to avoid taking antibiotics within four hours of her multivitamin. - Scheduled follow-up in 6-8 weeks to assess response.  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 Laryngoscopy confirmed persistent polyps with erythema and chronic polypoid changes. Smoking cessation is critical for improvement. - Discussed the necessity of smoking cessation for improvement of vocal cord polyps. - Discussed potential benefit of voice therapy after smoking cessation. - Deferred surgical intervention at this time, pending response to medical management and smoking cessation. - Reviewed laryngoscopic findings with her.  Chronic eczematous otitis externa, bilateral Chronic pruritus and dryness in both ear canals consistent with eczematous otitis  externa. - Prescribed low-dose steroid ear drops daily for one week, then taper as symptoms improve. - Provided education on proper ear care and avoidance of Q-tips.  Nicotine  dependence Currently smokes approximately three cigarettes daily and is actively attempting cessation. Prefers nicotine  patch due to work environment. - Prescribed nicotine  patch, starting at 7 mg as per her preference. - Discussed alternative nicotine  replacement options (lozenge, gum), but she preferred the patch. - Provided encouragement and support for cessation efforts.  Given the patient's tobacco use, I also discussed cessation and options for cessation, including counseling. Counseled patient on the dangers of tobacco use, advised patient to stop smoking, and reviewed strategies to maximize success. We reviewed treatment options to assist him quit smoking including NRT, Chantix, and Bupropion. Patient is ready to quit, and declined further treatment. Total time spent with this was 4 minutes.   CPT 99406 (4-10 mins)  Gastroesophageal reflux disease GERD with ongoing throat irritation and globus sensation. Currently on pantoprazole ; addition of famotidine  recommended. - Prescribed famotidine  to add to current pantoprazole  regimen. - Provided instructions regarding medication timing and potential interactions with multivitamins.  Meds ordered this encounter  Medications   famotidine  (PEPCID ) 20 MG tablet    Sig: Take 1 tablet (20 mg total) by mouth at bedtime.    Dispense:  90 tablet    Refill:  2   Fluocinolone  Acetonide 0.01 % OIL    Sig: Place 5 drops in ear(s) 2 (two) times daily for 5 days. Then prn    Dispense:  20 mL    Refill:  0   nicotine  (NICODERM CQ  - DOSED IN MG/24 HR) 7 mg/24hr patch    Sig: RX #2 Weeks 7-8: 7 mg x 1 patch dailyWear for 24 hours. If you have sleep disturbances, remove at bedtime..    Dispense:  14 patch    Refill:  0   fluticasone  (FLONASE ) 50 MCG/ACT nasal spray    Sig:  Place 2 sprays into both nostrils daily.    Dispense:  16 g    Refill:  6   doxycycline  (VIBRA -TABS) 100 MG tablet    Sig: Take 1 tablet (100 mg total) by mouth 2 (two) times daily.    Dispense:  28 tablet    Refill:  0      Penne Croak 10/25/2024 2:49 PM Available on Haiku chat and [employer]  Department of Otolaryngology Contact Info: Otolaryngology [employer] including questions about appointments or questions: [phone] - If after normal business hours (Monday-Friday after 5PM or Weekends/Holidays), please call same number and follow prompts for Patient Access Line. There is a physician on call for urgent matters. For life threatening emergencies, please call 911

## 2024-10-29 ENCOUNTER — Encounter: Payer: Self-pay | Admitting: Dermatology

## 2024-10-29 ENCOUNTER — Ambulatory Visit: Admitting: Dermatology

## 2024-10-29 VITALS — BP 127/76 | HR 83

## 2024-10-29 DIAGNOSIS — T22012A Burn of unspecified degree of left forearm, initial encounter: Secondary | ICD-10-CM | POA: Diagnosis not present

## 2024-10-29 DIAGNOSIS — L578 Other skin changes due to chronic exposure to nonionizing radiation: Secondary | ICD-10-CM

## 2024-10-29 DIAGNOSIS — T300 Burn of unspecified body region, unspecified degree: Secondary | ICD-10-CM

## 2024-10-29 DIAGNOSIS — W908XXA Exposure to other nonionizing radiation, initial encounter: Secondary | ICD-10-CM

## 2024-10-29 DIAGNOSIS — L739 Follicular disorder, unspecified: Secondary | ICD-10-CM

## 2024-10-29 DIAGNOSIS — L810 Postinflammatory hyperpigmentation: Secondary | ICD-10-CM | POA: Diagnosis not present

## 2024-10-29 NOTE — Progress Notes (Signed)
" ° °  New Patient Visit   History of Present Illness Deborah Peterson is a 58 year old female who presents with a resolved skin lesion on her arm.  A skin lesion appeared on her arm around September 21st, 2025, which she documented with photographs. The lesion persisted for a little over a month before resolving. She describes the lesion as 'angry' in appearance. She has a history of frequent tanning bed use.  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 The burn was described as 'pretty angry' and occurred when she was accidentally clipped by a hot part of the oven.  In terms of skin care, she does not currently use any moisturizer or sunscreen on her face. She has expressed concern about her skin being dry, describing herself as 'like a saltine cracker'.  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  Pt has a growth that has been improving but she'd like evaluated. She has no hx of skin cancer nor family hx  The following portions of the chart were reviewed this encounter and updated as appropriate: medications, allergies, medical history  Review of Systems:  No other skin or systemic complaints except as noted in HPI or Assessment and Plan.  Objective  Well appearing patient in no apparent distress; mood and affect are within normal limits.  A focused examination was performed of the following areas: Left upper arm  Relevant exam findings are noted in the Assessment and Plan.    Assessment & Plan   FOLLICULITIS left upper arm (resolved) Exam: Perifollicular erythematous papules and pustules as seen by photo   Folliculitis occurs due to inflammation of the superficial hair follicle (pore), resulting in acne-like lesions (pus bumps). It can be infectious (bacterial, fungal) or noninfectious (shaving, tight clothing, heat/sweat, medications).  Folliculitis can be acute or chronic and recommended treatment depends on the underlying cause  of folliculitis.  Treatment Plan: watch for recurrence  POST-INFLAMMATORY HYPERPIGMENTATION (PIH) Exam: hyperpigmented macules and/or patches at left upper arm   Post-inflammatory hyperpimentation (PIH)  is a benign condition that comes from having previous inflammation in the skin and will fade with time over months to sometimes years. Recommend daily sun protection including sunscreen SPF 30+ to sun-exposed areas. - Recommend treating any itchy or red areas on the skin quickly to prevent new areas of PIH. Once rash has cleared, treating with prescription medicines such as hydroquinone may help fade dark spots faster.    Treatment Plan:  sun screen and monitoring  Burn of forearm- left Burn healing with scabbing present. - Apply Vaseline to the burn daily to aid healing.  ACTINIC DAMAGE - chronic, secondary to cumulative UV radiation exposure/sun exposure over time - diffuse scaly erythematous macules with underlying dyspigmentation - Recommend daily broad spectrum sunscreen SPF 30+ to sun-exposed areas, reapply every 2 hours as needed.  - Recommend staying in the shade or wearing long sleeves, sun glasses (UVA+UVB protection) and wide brim hats (4-inch brim around the entire circumference of the hat). - Call for new or changing lesions.    Return for TBSE with brenda.  I, Darice Smock, CMA, am acting as scribe for RUFUS CHRISTELLA HOLY, MD.   Documentation: I have reviewed the above documentation for accuracy and completeness, and I agree with the above.  RUFUS CHRISTELLA HOLY, MD    "

## 2024-10-29 NOTE — Patient Instructions (Signed)

## 2024-10-30 ENCOUNTER — Encounter (INDEPENDENT_AMBULATORY_CARE_PROVIDER_SITE_OTHER): Payer: Self-pay

## 2024-11-05 ENCOUNTER — Encounter (HOSPITAL_BASED_OUTPATIENT_CLINIC_OR_DEPARTMENT_OTHER): Payer: Self-pay

## 2024-11-05 ENCOUNTER — Ambulatory Visit (HOSPITAL_BASED_OUTPATIENT_CLINIC_OR_DEPARTMENT_OTHER): Admit: 2024-11-05

## 2024-11-05 SURGERY — RECONSTRUCTION, NOSE, WITH NASAL SEPTUM REPAIR
Anesthesia: General

## 2024-11-09 ENCOUNTER — Ambulatory Visit (INDEPENDENT_AMBULATORY_CARE_PROVIDER_SITE_OTHER)

## 2024-11-09 DIAGNOSIS — E538 Deficiency of other specified B group vitamins: Secondary | ICD-10-CM | POA: Diagnosis not present

## 2024-11-09 DIAGNOSIS — Z23 Encounter for immunization: Secondary | ICD-10-CM

## 2024-11-09 MED ORDER — CYANOCOBALAMIN 1000 MCG/ML IJ SOLN
1000.0000 ug | Freq: Once | INTRAMUSCULAR | Status: AC
  Administered 2024-11-09: 1000 ug via INTRAMUSCULAR

## 2024-11-09 NOTE — Addendum Note (Signed)
 Addended by: CARVIN SPELLER on: 11/09/2024 02:53 PM   Modules accepted: Orders

## 2024-11-09 NOTE — Progress Notes (Signed)
 Patient is in office today for a nurse visit for B12 Injection and flu  Patient Injection was given in the  Right deltoid. Patient tolerated injection well.  Also flu was given in left deltoid.

## 2024-11-12 ENCOUNTER — Encounter (INDEPENDENT_AMBULATORY_CARE_PROVIDER_SITE_OTHER)

## 2024-11-13 ENCOUNTER — Encounter (INDEPENDENT_AMBULATORY_CARE_PROVIDER_SITE_OTHER): Payer: Self-pay

## 2024-11-14 ENCOUNTER — Ambulatory Visit
Admission: EM | Admit: 2024-11-14 | Discharge: 2024-11-14 | Disposition: A | Discharge status: Home / Self Care | Attending: Family Medicine | Admitting: Family Medicine

## 2024-11-14 ENCOUNTER — Ambulatory Visit
Admission: RE | Admit: 2024-11-14 | Discharge: 2024-11-14 | Disposition: A | Discharge status: Home / Self Care | Source: Ambulatory Visit | Attending: Emergency Medicine | Admitting: Emergency Medicine

## 2024-11-14 ENCOUNTER — Ambulatory Visit: Payer: Self-pay

## 2024-11-14 DIAGNOSIS — M79632 Pain in left forearm: Secondary | ICD-10-CM | POA: Diagnosis not present

## 2024-11-14 DIAGNOSIS — W548XXA Other contact with dog, initial encounter: Secondary | ICD-10-CM

## 2024-11-14 DIAGNOSIS — J849 Interstitial pulmonary disease, unspecified: Secondary | ICD-10-CM

## 2024-11-14 MED ORDER — MUPIROCIN 2 % EX OINT: 1.0000 | TOPICAL_OINTMENT | Freq: Three times a day (TID) | CUTANEOUS | 0 refills | Status: AC

## 2024-11-14 MED ORDER — TETANUS-DIPHTH-ACELL PERTUSSIS 5-2-15.5 LF-MCG/0.5 IM SUSP: 0.5000 mL | Freq: Once | INTRAMUSCULAR | Status: DC

## 2024-11-14 NOTE — Telephone Encounter (Signed)
 Noted

## 2024-11-14 NOTE — Discharge Instructions (Addendum)
 Change your dressing 2-3 times daily. Every time you change your dressing, clean the wound gently with warm water and Dial antibacterial soap. Pat the wound dry, let it breathe for roughly an hour before covering it back up. When you reapply a dressing, apply Bacitracin ointment to the wound, then cover with non-stick/non-adherent gauze.  After 4-7 days, the wound should scab over nicely and then you don't have to continue doing dressings.

## 2024-11-14 NOTE — Telephone Encounter (Signed)
 FYI Only or Action Required?: FYI only for provider: ED advised.  Patient was last seen in primary care on 09/26/2024 by Sebastian Beverley NOVAK, MD.  Called Nurse Triage reporting Laceration.  Symptoms began yesterday.  Interventions attempted: Other: covered.  Symptoms are: unchanged.  Triage Disposition: Go to ED Now (Notify PCP)  Patient/caregiver understands and will follow disposition?: No, wishes to speak with PCP  CAL notified, no openings for today.    Copied from CRM #[id number]. Topic: Clinical - Red Word Triage >> Nov 14, 2024  9:49 AM Deborah Peterson wrote: Red Word that prompted transfer to Nurse Triage: Open wound/skin flap from dog think she needs stitches Reason for Disposition  Skin is split open or gaping (or length > 1/2 inch or 12 mm on the skin, 1/4 inch or 6 mm on the face)  Answer Assessment - Initial Assessment Questions Pt's dog jumped up on her last night and scratched her arm in a couple of places. She's it is larger than an inch skin flap. She states she is concerned because last time he did this she didn't get it repaired and ended up with a keloid scar. Happened about 8pm.  Per protocol, Rn recommended ER based on the size of the laceration. Pt asked as opposed to UC. Rn gave rationale. She asked if anyone in the office could do it. Rn again gave rationale given the size, will forward to clinic to see if they can schedule in the office.  Due to ER dispo, not all questions answered.     1. APPEARANCE of INJURY: What does the injury look like?      Skin flap 2. ONSET: How long ago did the injury occur?      8pm last night 3. LOCATION: Where is the injury located?       4. SIZE: How large is the cut?      Bigger than an inch 5. BLEEDING: Is it bleeding now? If Yes, ask: Is it difficult to stop?      Bleeding stopped 6. PAIN: Is there any pain? If Yes, ask: How bad is the pain? (Scale 0-10; or none, mild, moderate, severe)      7. MECHANISM:  Tell me how it happened.      Dog jumped on her 8. TETANUS: When was your last tetanus booster?  Protocols used: Cuts and Lacerations-A-AH

## 2024-11-14 NOTE — ED Provider Notes (Signed)
 " [facility]  Note:  This document was prepared using Conservation officer, historic buildings and may include unintentional dictation errors.  MRN: [id number] DOB: 1966-05-31  Subjective:   Deborah Peterson is a 59 y.o. female presenting for 1 day history of persistent left forearm pain.  Sustained multiple injuries from her large puppies scratching her left forearm last night.  Patient's dogs are up-to-date on their vaccinations.  No fever, drainage of pus.  The wounds did weep and bleed some today.  Current Outpatient Medications  Medication Instructions   acetaminophen  (TYLENOL ) 500 mg, Oral, Every 8 hours PRN   albuterol  (VENTOLIN  HFA) 108 (90 Base) MCG/ACT inhaler 2 puffs, Inhalation, Every 6 hours PRN   amphetamine-dextroamphetamine (ADDERALL) 10 MG tablet 10 mg, 2 times daily   cyclobenzaprine  (FLEXERIL ) 10 mg, Oral, 3 times daily PRN   doxycycline  (VIBRA -TABS) 100 mg, Oral, 2 times daily   famotidine  (PEPCID ) 20 mg, Oral, Daily at bedtime   fluticasone  (FLONASE ) 50 MCG/ACT nasal spray 2 sprays, Each Nare, Daily   folic acid  (FOLVITE ) 1 mg, Oral, Daily   Multiple Vitamin (MULTIVITAMIN WITH MINERALS) TABS tablet 1 tablet, Oral, Daily   nicotine  (NICODERM CQ  - DOSED IN MG/24 HR) 7 mg/24hr patch RX #2 Weeks 7-8: 7 mg x 1 patch dailyWear for 24 hours. If you have sleep disturbances, remove at bedtime..   ondansetron  (ZOFRAN -ODT) 4 mg, Oral, Every 8 hours PRN   pantoprazole  (PROTONIX ) 40 mg, Oral, Daily   simethicone  (GAS-X) 80 mg, Oral, Every 6 hours PRN    Allergies[1]  Past Medical History:  Diagnosis Date   Alcoholism (HCC)    Allergy 1985   Anxiety 1970   GERD (gastroesophageal reflux disease) 2017   Ulcer 2019   gastric ulcers     Past Surgical History:  Procedure Laterality Date   ABDOMINAL HYSTERECTOMY     CESAREAN SECTION     TUBAL LIGATION  1996    Family History  Problem Relation Age of Onset   Breast cancer Mother    Cancer Mother         breast   Alzheimer's disease Mother    Heart disease Father    Breast cancer Maternal Aunt    Breast cancer Maternal Grandmother    Arthritis Maternal Grandmother    Vision loss Maternal Grandmother    Cancer Paternal Grandmother        ovarian   COPD Paternal Grandmother    Vision loss Paternal Grandmother    Heart disease Paternal Grandfather    ADD / ADHD Son     Social History   Occupational History   Not on file  Tobacco Use   Smoking status: Every Day    Current packs/day: 0.25    Average packs/day: 0.8 packs/day for 45.0 years (34.0 ttl pk-yrs)    Types: Cigarettes    Start date: 1981   Smokeless tobacco: Never   Tobacco comments:    Down to 3 cigs per day 10/03/2024   Vaping Use   Vaping status: Never Used  Substance and Sexual Activity   Alcohol use: Not Currently    Alcohol/week: 6.0 standard drinks of alcohol    Types: 6 Cans of beer per week   Drug use: Never   Sexual activity: Not Currently    Birth control/protection: Abstinence, Surgical     ROS   Objective:   Vitals: BP (!) 141/71 (BP Location: Right Arm)   Pulse 93   Temp 98.7 F (  37.1 C) (Oral)   Resp 16   SpO2 96%   Physical Exam Constitutional:      General: She is not in acute distress.    Appearance: Normal appearance. She is well-developed. She is not ill-appearing, toxic-appearing or diaphoretic.  HENT:     Head: Normocephalic and atraumatic.     Nose: Nose normal.     Mouth/Throat:     Mouth: Mucous membranes are moist.  Eyes:     General: No scleral icterus.       Right eye: No discharge.        Left eye: No discharge.     Extraocular Movements: Extraocular movements intact.  Cardiovascular:     Rate and Rhythm: Normal rate.  Pulmonary:     Effort: Pulmonary effort is normal.  Musculoskeletal:       Arms:  Skin:    General: Skin is warm and dry.  Neurological:     General: No focal deficit present.     Mental Status: She is alert and oriented to person,  place, and time.  Psychiatric:        Mood and Affect: Mood normal.        Behavior: Behavior normal.    Wounds cleansed and dressed.  Assessment and Plan :   PDMP not reviewed this encounter.  1. Left forearm pain   2. Dog scratch      Tetanus is up-to-date.  Wounds do not appear to be infected.  Must heal by secondary intention.  Wound care reviewed.  Counseled patient on potential for adverse effects with medications prescribed/recommended today, ER and return-to-clinic precautions discussed, patient verbalized understanding.     [1]  Allergies Allergen Reactions   Erythromycin Anaphylaxis   Motrin [Ibuprofen]     unknown   Penicillins     unknown   Tramadol     unknown   Ciprofloxacin Itching     Christopher Savannah, PA-C 11/14/24 1553  "

## 2024-11-14 NOTE — ED Triage Notes (Signed)
 Pt reports she was scratched in the left arm by her dog last night., Per pt the dog has all the vaccines are up to date.

## 2024-11-14 NOTE — Telephone Encounter (Signed)
 Spoke to patient. She stated she didn't want to go to ED. She said she will try urgent care first. If they can't help she will go to ED.

## 2024-11-14 NOTE — Telephone Encounter (Signed)
 Left message for patient to return call.

## 2024-11-21 ENCOUNTER — Encounter (INDEPENDENT_AMBULATORY_CARE_PROVIDER_SITE_OTHER): Payer: Self-pay

## 2024-11-23 ENCOUNTER — Ambulatory Visit: Payer: Self-pay | Admitting: Emergency Medicine

## 2024-11-26 NOTE — Telephone Encounter (Addendum)
 X2 VMB full   X1  VMB full

## 2024-11-30 ENCOUNTER — Other Ambulatory Visit: Payer: Self-pay | Admitting: Medical Genetics

## 2024-11-30 DIAGNOSIS — Z006 Encounter for examination for normal comparison and control in clinical research program: Secondary | ICD-10-CM

## 2024-12-06 ENCOUNTER — Ambulatory Visit: Admitting: Emergency Medicine

## 2024-12-12 ENCOUNTER — Ambulatory Visit

## 2024-12-13 NOTE — Progress Notes (Signed)
 Atc x3. Mailbox is full, unable to leave msg.  Sending Mychart message.

## 2025-01-09 ENCOUNTER — Ambulatory Visit: Admitting: Physician Assistant
